# Patient Record
Sex: Female | Born: 1995 | Race: White | Hispanic: No | Marital: Single | Smoking: Former smoker
Health system: Southern US, Community
[De-identification: ages and names within clinical notes are randomized; demographics above are authoritative.]

## PROBLEM LIST (undated history)

## (undated) ENCOUNTER — Emergency Department (HOSPITAL_COMMUNITY): Admission: EM | Discharge status: Absent without leave | Payer: No Typology Code available for payment source

## (undated) DIAGNOSIS — Z789 Other specified health status: Secondary | ICD-10-CM

## (undated) HISTORY — PX: TONSILLECTOMY AND ADENOIDECTOMY: SUR1326

---

## 2020-01-27 ENCOUNTER — Observation Stay (HOSPITAL_COMMUNITY)
Admission: EM | Admit: 2020-01-27 | Discharge: 2020-01-28 | Disposition: A | Discharge status: Home / Self Care | Payer: No Typology Code available for payment source | Attending: Physician Assistant | Admitting: Physician Assistant

## 2020-01-27 ENCOUNTER — Other Ambulatory Visit: Payer: Self-pay

## 2020-01-27 ENCOUNTER — Emergency Department (HOSPITAL_COMMUNITY): Payer: No Typology Code available for payment source

## 2020-01-27 DIAGNOSIS — S36114A Minor laceration of liver, initial encounter: Secondary | ICD-10-CM | POA: Diagnosis not present

## 2020-01-27 DIAGNOSIS — M2550 Pain in unspecified joint: Secondary | ICD-10-CM | POA: Diagnosis not present

## 2020-01-27 DIAGNOSIS — Z23 Encounter for immunization: Secondary | ICD-10-CM | POA: Insufficient documentation

## 2020-01-27 DIAGNOSIS — S0990XA Unspecified injury of head, initial encounter: Secondary | ICD-10-CM | POA: Diagnosis present

## 2020-01-27 DIAGNOSIS — F17210 Nicotine dependence, cigarettes, uncomplicated: Secondary | ICD-10-CM | POA: Diagnosis not present

## 2020-01-27 DIAGNOSIS — S36113A Laceration of liver, unspecified degree, initial encounter: Secondary | ICD-10-CM | POA: Diagnosis present

## 2020-01-27 DIAGNOSIS — Z20822 Contact with and (suspected) exposure to covid-19: Secondary | ICD-10-CM | POA: Diagnosis not present

## 2020-01-27 DIAGNOSIS — S0102XA Laceration with foreign body of scalp, initial encounter: Principal | ICD-10-CM | POA: Insufficient documentation

## 2020-01-27 DIAGNOSIS — Y92410 Unspecified street and highway as the place of occurrence of the external cause: Secondary | ICD-10-CM | POA: Insufficient documentation

## 2020-01-27 HISTORY — DX: Other specified health status: Z78.9

## 2020-01-27 LAB — I-STAT CHEM 8, ED
BUN: 13 mg/dL (ref 6–20)
Calcium, Ion: 1.23 mmol/L (ref 1.15–1.40)
Chloride: 106 mmol/L (ref 98–111)
Creatinine, Ser: 0.9 mg/dL (ref 0.44–1.00)
Glucose, Bld: 93 mg/dL (ref 70–99)
HCT: 40 % (ref 36.0–46.0)
Hemoglobin: 13.6 g/dL (ref 12.0–15.0)
Potassium: 3.8 mmol/L (ref 3.5–5.1)
Sodium: 139 mmol/L (ref 135–145)
TCO2: 23 mmol/L (ref 22–32)

## 2020-01-27 LAB — I-STAT BETA HCG BLOOD, ED (MC, WL, AP ONLY): I-stat hCG, quantitative: <5 m[IU]/mL (ref <5)

## 2020-01-27 MED ORDER — LIDOCAINE HCL (PF) 1 % IJ SOLN
10.0000 mL | Freq: Once | INTRAMUSCULAR | Status: AC
  Administered 2020-01-27: 10 mL
  Filled 2020-01-27: qty 10

## 2020-01-27 MED ORDER — MORPHINE SULFATE (PF) 4 MG/ML IV SOLN
4.0000 mg | Freq: Once | INTRAVENOUS | Status: AC
  Administered 2020-01-27: 4 mg via INTRAVENOUS
  Filled 2020-01-27: qty 1

## 2020-01-27 MED ORDER — TETANUS-DIPHTH-ACELL PERTUSSIS 5-2.5-18.5 LF-MCG/0.5 IM SUSY
0.5000 mL | PREFILLED_SYRINGE | Freq: Once | INTRAMUSCULAR | Status: AC
  Administered 2020-01-27: 0.5 mL via INTRAMUSCULAR
  Filled 2020-01-27: qty 0.5
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
## 2020-01-28 ENCOUNTER — Emergency Department (HOSPITAL_COMMUNITY): Payer: No Typology Code available for payment source

## 2020-01-28 ENCOUNTER — Other Ambulatory Visit (HOSPITAL_COMMUNITY): Payer: Medicaid Other

## 2020-01-28 DIAGNOSIS — S36113A Laceration of liver, unspecified degree, initial encounter: Secondary | ICD-10-CM | POA: Diagnosis present

## 2020-01-28 DIAGNOSIS — S0102XA Laceration with foreign body of scalp, initial encounter: Secondary | ICD-10-CM | POA: Diagnosis not present

## 2020-01-28 LAB — COMPREHENSIVE METABOLIC PANEL
ALT: 48 U/L — ABNORMAL HIGH (ref 0–44)
ALT: 43 U/L (ref 0–44)
AST: 65 U/L — ABNORMAL HIGH (ref 15–41)
AST: 43 U/L — ABNORMAL HIGH (ref 15–41)
Albumin: 3.5 g/dL (ref 3.5–5.0)
Albumin: 3.3 g/dL — ABNORMAL LOW (ref 3.5–5.0)
Alkaline Phosphatase: 40 U/L (ref 38–126)
Alkaline Phosphatase: 38 U/L (ref 38–126)
Anion gap: 9 (ref 5–15)
Anion gap: 7 (ref 5–15)
BUN: 10 mg/dL (ref 6–20)
BUN: 7 mg/dL (ref 6–20)
CO2: 21 mmol/L — ABNORMAL LOW (ref 22–32)
CO2: 21 mmol/L — ABNORMAL LOW (ref 22–32)
Calcium: 8.8 mg/dL — ABNORMAL LOW (ref 8.9–10.3)
Calcium: 8.5 mg/dL — ABNORMAL LOW (ref 8.9–10.3)
Chloride: 106 mmol/L (ref 98–111)
Chloride: 109 mmol/L (ref 98–111)
Creatinine, Ser: 0.95 mg/dL (ref 0.44–1.00)
Creatinine, Ser: 0.95 mg/dL (ref 0.44–1.00)
GFR, Estimated: >60 mL/min (ref >60)
GFR, Estimated: >60 mL/min (ref >60)
Glucose, Bld: 111 mg/dL — ABNORMAL HIGH (ref 70–99)
Glucose, Bld: 89 mg/dL (ref 70–99)
Potassium: 3.5 mmol/L (ref 3.5–5.1)
Potassium: 3.7 mmol/L (ref 3.5–5.1)
Sodium: 136 mmol/L (ref 135–145)
Sodium: 137 mmol/L (ref 135–145)
Total Bilirubin: 0.8 mg/dL (ref 0.3–1.2)
Total Bilirubin: 1.1 mg/dL (ref 0.3–1.2)
Total Protein: 6 g/dL — ABNORMAL LOW (ref 6.5–8.1)
Total Protein: 5.7 g/dL — ABNORMAL LOW (ref 6.5–8.1)

## 2020-01-28 LAB — TYPE AND SCREEN
ABO/RH(D): A NEG
Antibody Screen: NEGATIVE

## 2020-01-28 LAB — CBC
HCT: 37.4 % (ref 36.0–46.0)
HCT: 36.2 % (ref 36.0–46.0)
Hemoglobin: 12.7 g/dL (ref 12.0–15.0)
Hemoglobin: 12.5 g/dL (ref 12.0–15.0)
MCH: 30.4 pg (ref 26.0–34.0)
MCH: 30.5 pg (ref 26.0–34.0)
MCHC: 34 g/dL (ref 30.0–36.0)
MCHC: 34.5 g/dL (ref 30.0–36.0)
MCV: 89.5 fL (ref 80.0–100.0)
MCV: 88.3 fL (ref 80.0–100.0)
Platelets: 299 10*3/uL (ref 150–400)
Platelets: 253 10*3/uL (ref 150–400)
RBC: 4.18 MIL/uL (ref 3.87–5.11)
RBC: 4.1 MIL/uL (ref 3.87–5.11)
RDW: 12 % (ref 11.5–15.5)
RDW: 12.1 % (ref 11.5–15.5)
WBC: 11 10*3/uL — ABNORMAL HIGH (ref 4.0–10.5)
WBC: 6.1 10*3/uL (ref 4.0–10.5)
nRBC: 0 % (ref 0.0–0.2)
nRBC: 0 % (ref 0.0–0.2)

## 2020-01-28 LAB — ABO/RH: ABO/RH(D): A NEG

## 2020-01-28 LAB — PROTIME-INR
INR: 1.1 (ref 0.8–1.2)
Prothrombin Time: 14.1 seconds (ref 11.4–15.2)

## 2020-01-28 LAB — RESP PANEL BY RT-PCR (FLU A&B, COVID) ARPGX2
Influenza A by PCR: NEGATIVE
Influenza B by PCR: NEGATIVE
SARS Coronavirus 2 by RT PCR: NEGATIVE

## 2020-01-28 LAB — HIV ANTIBODY (ROUTINE TESTING W REFLEX): HIV Screen 4th Generation wRfx: NONREACTIVE

## 2020-01-28 MED ORDER — ENOXAPARIN SODIUM 30 MG/0.3ML ~~LOC~~ SOLN: 30.0000 mg | Freq: Two times a day (BID) | SUBCUTANEOUS | Status: DC

## 2020-01-28 MED ORDER — MORPHINE SULFATE (PF) 2 MG/ML IV SOLN
2.0000 mg | INTRAVENOUS | Status: DC | PRN
Start: 2020-01-28 — End: 2020-01-28

## 2020-01-28 MED ORDER — ACETAMINOPHEN 325 MG PO TABS: 650.0000 mg | ORAL_TABLET | Freq: Four times a day (QID) | ORAL | 0 refills | Status: DC | PRN

## 2020-01-28 MED ORDER — FENTANYL CITRATE (PF) 100 MCG/2ML IJ SOLN
50.0000 ug | Freq: Once | INTRAMUSCULAR | Status: AC
  Administered 2020-01-28: 50 ug via INTRAVENOUS
  Filled 2020-01-28: qty 2

## 2020-01-28 MED ORDER — IBUPROFEN 600 MG PO TABS: 600.0000 mg | ORAL_TABLET | Freq: Four times a day (QID) | ORAL | 0 refills | Status: AC | PRN

## 2020-01-28 MED ORDER — ONDANSETRON 4 MG PO TBDP: 4.0000 mg | ORAL_TABLET | Freq: Four times a day (QID) | ORAL | Status: DC | PRN

## 2020-01-28 MED ORDER — OXYCODONE HCL 5 MG PO TABS: 5.0000 mg | ORAL_TABLET | Freq: Four times a day (QID) | ORAL | 0 refills | Status: AC | PRN

## 2020-01-28 MED ORDER — ACETAMINOPHEN 325 MG PO TABS: 650.0000 mg | ORAL_TABLET | ORAL | Status: DC | PRN

## 2020-01-28 MED ORDER — ONDANSETRON HCL 4 MG/2ML IJ SOLN: 4.0000 mg | Freq: Four times a day (QID) | INTRAMUSCULAR | Status: DC | PRN

## 2020-01-28 MED ORDER — SODIUM CHLORIDE 0.9 % IV SOLN: INTRAVENOUS | Status: DC

## 2020-01-28 MED ORDER — DOXYCYCLINE HYCLATE 100 MG PO CAPS: 100.0000 mg | ORAL_CAPSULE | Freq: Two times a day (BID) | ORAL | 0 refills | Status: AC

## 2020-01-28 MED ORDER — IOHEXOL 300 MG/ML  SOLN
100.0000 mL | Freq: Once | INTRAMUSCULAR | Status: AC | PRN
  Administered 2020-01-28: 100 mL via INTRAVENOUS

## 2020-01-28 MED ORDER — OXYCODONE HCL 5 MG PO TABS: 5.0000 mg | ORAL_TABLET | Freq: Four times a day (QID) | ORAL | 0 refills | Status: DC | PRN

## 2020-01-28 MED ORDER — OXYCODONE HCL 5 MG PO TABS
10.0000 mg | ORAL_TABLET | ORAL | Status: DC | PRN
  Administered 2020-01-28: 10 mg via ORAL
  Filled 2020-01-28: qty 2

## 2020-01-28 MED ORDER — OXYCODONE HCL 5 MG PO TABS
5.0000 mg | ORAL_TABLET | ORAL | Status: DC | PRN
  Administered 2020-01-28: 5 mg via ORAL
  Filled 2020-01-28 (×3): qty 1
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
## 2020-01-28 NOTE — Progress Notes (Signed)
Jessica Page to be D/C'd  per MD order. Discussed with the patient and all questions fully answered.  VSS, Skin clean, dry and intact without evidence of skin break down, no evidence of skin tears noted.  IV catheter discontinued intact. Site without signs and symptoms of complications. Dressing and pressure applied.  An After Visit Summary was printed and given to the patient.  D/c education completed with patient/family including follow up instructions, medication list, d/c activities limitations if indicated, with other d/c instructions as indicated by MD - patient able to verbalize understanding, all questions fully answered.   Patient instructed to return to ED, call 911, or call MD for any changes in condition.   Patient to be escorted via WC, and D/C home via private auto.

## 2020-01-28 NOTE — Plan of Care (Signed)

## 2020-01-28 NOTE — ED Notes (Signed)
Pt is in radiology at this time.  

## 2020-01-28 NOTE — ED Notes (Signed)
Dr. Renaye Rakers performed lacertation repair. Staples intact.
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
## 2020-02-05 NOTE — L&D Delivery Note (Addendum)
OB/GYN Faculty Practice Delivery Note  Jessica Page is a 25 y.o. G3P0020 at [redacted]w[redacted]d admitted for SROM and contractions.   GBS Status: Unknown Maximum Maternal Temperature: 99.1  Labor course: Initial SVE: 4 cm. Augmentation with: AROM. She then progressed to complete.  ROM: 12h 78m with meconium stained fluid  Birth: At 1947 a viable female was delivered via spontaneous vaginal delivery (Presentation: ROA). Nuchal cord present: No.  Shoulders and body delivered in usual fashion. Infant placed directly on mom's abdomen for bonding/skin-to-skin, baby dried and stimulated. Cord clamped x 2 after 1 minute and cut by FOB, Dylan.  Cord blood collected.  The placenta separated spontaneously and delivered via gentle cord traction.  Pitocin infused rapidly IV per protocol.  Fundus firm with massage.  Placenta inspected and appears to be intact with a 3 VC.  Placenta/Cord with the following complications: none.  Cord pH: n/a Sponge and instrument count were correct x2.  Intrapartum complications:  None Anesthesia:  epidural Episiotomy: none Lacerations:  none Suture Repair:  n/a EBL (mL): 150   Infant: APGAR (1 MIN):  7 APGAR (5 MINS):  8 Infant weight: pending  Mom to postpartum.  Baby to Couplet care / Skin to Skin. Placenta to L&D   Plans to Breastfeed Contraception: Depo-Provera injections Circumcision: N/A  Note sent to [facility]:  [facility]  for pp visit.  Raelyn Mora , MSN, CNM 12/15/2020 8:09 PM

## 2020-05-24 ENCOUNTER — Other Ambulatory Visit: Payer: Self-pay

## 2020-05-24 ENCOUNTER — Encounter: Payer: Self-pay | Admitting: Obstetrics and Gynecology

## 2020-05-24 ENCOUNTER — Ambulatory Visit (INDEPENDENT_AMBULATORY_CARE_PROVIDER_SITE_OTHER): Payer: Medicaid Other | Admitting: Obstetrics and Gynecology

## 2020-05-24 VITALS — BP 121/76 | HR 96 | Resp 16 | Ht 67.0 in | Wt 133.7 lb

## 2020-05-24 DIAGNOSIS — O09299 Supervision of pregnancy with other poor reproductive or obstetric history, unspecified trimester: Secondary | ICD-10-CM | POA: Diagnosis not present

## 2020-05-24 DIAGNOSIS — Z7689 Persons encountering health services in other specified circumstances: Secondary | ICD-10-CM

## 2020-05-24 NOTE — Progress Notes (Signed)
HPI:      [name] is a 25 y.o. G1P0 who LMP was Patient's last menstrual period was 03/31/2020 (exact date).  Subjective:   She presents today after presenting to the emergency department and being found to have a positive pregnancy test.  Patient believes she is approximately [redacted] weeks gestational age based on last menstrual period.  She is not having bleeding or cramping.  She is taking prenatal vitamins.  She was not attempting pregnancy but not preventing it. Of significant note her last 2 pregnancies ended in first trimester losses.  She underwent D&E for incomplete miscarriage followed by a blighted ovum which was completed by intravaginal misoprostol. She has occasional nausea and vomiting but not significant at this time.    Hx: The following portions of the patient's history were reviewed and updated as appropriate:             She  has a past medical history of Known health problems: none. She does not have any pertinent problems on file. She  has a past surgical history that includes Tonsillectomy and adenoidectomy. Her family history is not on file. She  reports that she has been smoking cigarettes. She has never used smokeless tobacco. She reports previous alcohol use. She reports current drug use. Drug: Marijuana. She has a current medication list which includes the following prescription(s): acetaminophen. She is allergic to codeine, amoxicillin, and penicillins.       Review of Systems:  Review of Systems  Constitutional: Denied constitutional symptoms, night sweats, recent illness, fatigue, fever, insomnia and weight loss.  Eyes: Denied eye symptoms, eye pain, photophobia, vision change and visual disturbance.  Ears/Nose/Throat/Neck: Denied ear, nose, throat or neck symptoms, hearing loss, nasal discharge, sinus congestion and sore throat.  Cardiovascular: Denied cardiovascular symptoms, arrhythmia, chest pain/pressure, edema, exercise intolerance, orthopnea and  palpitations.  Respiratory: Denied pulmonary symptoms, asthma, pleuritic pain, productive sputum, cough, dyspnea and wheezing.  Gastrointestinal: Denied, gastro-esophageal reflux, melena, nausea and vomiting.  Genitourinary: Denied genitourinary symptoms including symptomatic vaginal discharge, pelvic relaxation issues, and urinary complaints.  Musculoskeletal: Denied musculoskeletal symptoms, stiffness, swelling, muscle weakness and myalgia.  Dermatologic: Denied dermatology symptoms, rash and scar.  Neurologic: Denied neurology symptoms, dizziness, headache, neck pain and syncope.  Psychiatric: Denied psychiatric symptoms, anxiety and depression.  Endocrine: Denied endocrine symptoms including hot flashes and night sweats.   Meds:   Current Outpatient Medications on File Prior to Visit  Medication Sig Dispense Refill  . acetaminophen (TYLENOL) 325 MG tablet Take 2 tablets (650 mg total) by mouth every 6 (six) hours as needed for up to 30 doses for mild pain or moderate pain. 30 tablet 0   No current facility-administered medications on file prior to visit.          Objective:     Vitals:   05/24/20 1033  BP: 121/76  Pulse: 96  Resp: 16   Filed Weights   05/24/20 1033  Weight: 133 lb 11.2 oz (60.6 kg)                Assessment:    G1P0 Patient Active Problem List   Diagnosis Date Noted  . Liver laceration, closed 01/28/2020     1. Encounter to establish care   2. History of miscarriage, currently pregnant     Approximate 8 weeks estimated gestational age.   Plan:            Prenatal Plan 1.  The patient was given prenatal literature. 2.  She was continued on prenatal vitamins. 3.  A prenatal lab panel to be drawn at nurse visit. 4.  An ultrasound was ordered to better determine an EDC. 5.  A nurse visit was scheduled. 6.  Genetic testing and testing for other inheritable conditions discussed in detail. She will decide in the future whether to have these  labs performed. 7.  A general overview of pregnancy testing, visit schedule, ultrasound schedule, and prenatal care was discussed. 8.  COVID and its risks associated with pregnancy, prevention by limiting exposure and use of masks, as well as the risks and benefits of vaccination during pregnancy were discussed in detail.  [facility] policy regarding office and hospital visitation and testing was explained. 9.  Benefits of breast-feeding discussed in detail including both maternal and infant benefits. Ready Set Baby website discussed.   Orders Orders Placed This Encounter  Procedures  . US OB Comp Less 14 Wks    No orders of the defined types were placed in this encounter.     F/U  Return in about 5 weeks (around 06/28/2020). I spent 33 minutes involved in the care of this patient preparing to see the patient by obtaining and reviewing her medical history (including labs, imaging tests and prior procedures), documenting clinical information in the electronic health record (EHR), counseling and coordinating care plans, writing and sending prescriptions, ordering tests or procedures and directly communicating with the patient by discussing pertinent items from her history and physical exam as well as detailing my assessment and plan as noted above so that she has an informed understanding.  All of her questions were answered.  Elonda Husky, M.D. 05/24/2020 11:17 AM

## 2020-07-18 ENCOUNTER — Ambulatory Visit
Admission: RE | Admit: 2020-07-18 | Discharge: 2020-07-18 | Disposition: A | Discharge status: Home / Self Care | Payer: Medicaid Other | Source: Ambulatory Visit | Attending: Obstetrics and Gynecology | Admitting: Obstetrics and Gynecology

## 2020-07-18 ENCOUNTER — Other Ambulatory Visit: Payer: Self-pay

## 2020-07-18 ENCOUNTER — Other Ambulatory Visit: Payer: Self-pay | Admitting: Obstetrics and Gynecology

## 2020-07-18 DIAGNOSIS — O09299 Supervision of pregnancy with other poor reproductive or obstetric history, unspecified trimester: Secondary | ICD-10-CM

## 2020-07-27 ENCOUNTER — Telehealth: Payer: Self-pay | Admitting: Obstetrics and Gynecology

## 2020-07-27 NOTE — Telephone Encounter (Signed)
Patient called and stated that she never got a call back regarding her [place] results.  She states that she wants to make sure that everything is okay.  Since the [place] she has moved away and will be seeking care elsewhere.

## 2020-07-27 NOTE — Telephone Encounter (Signed)
Patient and dental Office called stating they need a dental clearance form faxed to [phone].  Please advise.

## 2020-07-27 NOTE — Telephone Encounter (Signed)
Patient's mother called asking about a call back regarding previous messages about pt having dental work .

## 2020-07-28 ENCOUNTER — Telehealth: Payer: Self-pay | Admitting: Obstetrics and Gynecology

## 2020-07-28 NOTE — Telephone Encounter (Signed)
Dental clearance sent via my chart.   Pt has not been seen in 2 months. She has recently moved but plans to delivery with [place]. Appt made for 6/28 at 730.   Pt appreciative of call.

## 2020-07-28 NOTE — Telephone Encounter (Signed)
Patient called x2 this morning, she is requesting a response in regard to her calls and mychart messages- asking for dental clearance to have teeth extraction today. Please Advise.

## 2020-08-01 ENCOUNTER — Encounter: Payer: Medicaid Other | Admitting: Obstetrics and Gynecology

## 2020-08-01 NOTE — Telephone Encounter (Signed)
LM for patient to return call.

## 2020-08-03 ENCOUNTER — Inpatient Hospital Stay (HOSPITAL_COMMUNITY)
Admission: AD | Admit: 2020-08-03 | Discharge: 2020-08-03 | Disposition: A | Discharge status: Home / Self Care | Payer: Medicaid Other | Attending: Obstetrics & Gynecology | Admitting: Obstetrics & Gynecology

## 2020-08-03 ENCOUNTER — Encounter (HOSPITAL_COMMUNITY): Payer: Self-pay | Admitting: Obstetrics & Gynecology

## 2020-08-03 ENCOUNTER — Other Ambulatory Visit: Payer: Self-pay

## 2020-08-03 DIAGNOSIS — Z3A00 Weeks of gestation of pregnancy not specified: Secondary | ICD-10-CM | POA: Insufficient documentation

## 2020-08-03 DIAGNOSIS — O26899 Other specified pregnancy related conditions, unspecified trimester: Secondary | ICD-10-CM | POA: Diagnosis present

## 2020-08-03 DIAGNOSIS — R1030 Lower abdominal pain, unspecified: Secondary | ICD-10-CM | POA: Insufficient documentation

## 2020-08-03 DIAGNOSIS — O219 Vomiting of pregnancy, unspecified: Secondary | ICD-10-CM | POA: Insufficient documentation

## 2020-08-03 LAB — URINALYSIS, ROUTINE W REFLEX MICROSCOPIC
Bilirubin Urine: NEGATIVE
Glucose, UA: 50 mg/dL — AB
Hgb urine dipstick: NEGATIVE
Ketones, ur: NEGATIVE mg/dL
Leukocytes,Ua: NEGATIVE
Nitrite: NEGATIVE
Protein, ur: NEGATIVE mg/dL
Specific Gravity, Urine: 1.01 (ref 1.005–1.030)
pH: 5 (ref 5.0–8.0)

## 2020-08-03 LAB — POC URINE PREG, ED: Preg Test, Ur: POSITIVE — AB
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
## 2020-08-03 NOTE — Progress Notes (Signed)
Not in lobby
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
## 2020-08-10 ENCOUNTER — Other Ambulatory Visit: Payer: Self-pay | Admitting: Obstetrics and Gynecology

## 2020-08-10 ENCOUNTER — Other Ambulatory Visit (HOSPITAL_COMMUNITY)
Admission: RE | Admit: 2020-08-10 | Discharge: 2020-08-10 | Disposition: A | Discharge status: Home / Self Care | Payer: Medicaid Other | Source: Ambulatory Visit | Attending: Obstetrics and Gynecology | Admitting: Obstetrics and Gynecology

## 2020-08-10 ENCOUNTER — Ambulatory Visit (INDEPENDENT_AMBULATORY_CARE_PROVIDER_SITE_OTHER): Payer: Medicaid Other | Admitting: Obstetrics and Gynecology

## 2020-08-10 ENCOUNTER — Other Ambulatory Visit: Payer: Self-pay

## 2020-08-10 ENCOUNTER — Encounter: Payer: Self-pay | Admitting: Obstetrics and Gynecology

## 2020-08-10 VITALS — BP 122/80 | HR 105 | Wt 146.9 lb

## 2020-08-10 DIAGNOSIS — Z3492 Encounter for supervision of normal pregnancy, unspecified, second trimester: Secondary | ICD-10-CM

## 2020-08-10 DIAGNOSIS — O0932 Supervision of pregnancy with insufficient antenatal care, second trimester: Secondary | ICD-10-CM

## 2020-08-10 DIAGNOSIS — Z124 Encounter for screening for malignant neoplasm of cervix: Secondary | ICD-10-CM

## 2020-08-10 DIAGNOSIS — O4420 Partial placenta previa NOS or without hemorrhage, unspecified trimester: Secondary | ICD-10-CM

## 2020-08-10 DIAGNOSIS — Z3A19 19 weeks gestation of pregnancy: Secondary | ICD-10-CM

## 2020-08-10 NOTE — Addendum Note (Signed)
Addended by: Dorian Pod on: 08/10/2020 03:04 PM   Modules accepted: Orders

## 2020-08-10 NOTE — Addendum Note (Signed)
Addended by: Elonda Husky on: 08/10/2020 03:12 PM   Modules accepted: Orders

## 2020-08-10 NOTE — Telephone Encounter (Signed)
Patient came in for appointment.  

## 2020-08-10 NOTE — Progress Notes (Signed)
NOB: Patient has missed several appointments.  MaterniT 21 today, AFP today.  Marginal placenta previa discussed in detail.  Follow-up ultrasound with anatomy scan in 2 weeks.  Pap performed  Physical examination General NAD, Conversant  HEENT Atraumatic; Op clear with mmm.  Normo-cephalic. Pupils reactive. Anicteric sclerae  Thyroid/Neck Smooth without nodularity or enlargement. Normal ROM.  Neck Supple.  Skin No rashes, lesions or ulceration. Normal palpated skin turgor. No nodularity.  Breasts: No masses or discharge.  Symmetric.  No axillary adenopathy.  Lungs: Clear to auscultation.No rales or wheezes. Normal Respiratory effort, no retractions.  Heart: NSR.  No murmurs or rubs appreciated. No periferal edema  Abdomen: Soft.  Non-tender.  No masses.  No HSM. No hernia  Extremities: Moves all appropriately.  Normal ROM for age. No lymphadenopathy.  Neuro: Oriented to PPT.  Normal mood. Normal affect.     Pelvic:   Vulva: Normal appearance.  No lesions.  Vagina: No lesions or abnormalities noted.  Support: Normal pelvic support.  Urethra No masses tenderness or scarring.  Meatus Normal size without lesions or prolapse.  Cervix: Normal appearance.  No lesions.  Anus: Normal exam.  No lesions.  Perineum: Normal exam.  No lesions.        Bimanual   Adnexae: No masses.  Non-tender to palpation.  Uterus: Enlarged. 18wks  Non-tender.  Mobile.  AV.  Adnexae: No masses.  Non-tender to palpation.  Cul-de-sac: Negative for abnormality.  Adnexae: No masses.  Non-tender to palpation.         Pelvimetry   Diagonal: Reached.  Spines: Average.  Sacrum: Concave.  Pubic Arch: Normal.

## 2020-08-11 LAB — MONITOR DRUG PROFILE 14(MW)
Amphetamine Scrn, Ur: NEGATIVE ng/mL
BARBITURATE SCREEN URINE: NEGATIVE ng/mL
BENZODIAZEPINE SCREEN, URINE: NEGATIVE ng/mL
Buprenorphine, Urine: NEGATIVE ng/mL
CANNABINOIDS UR QL SCN: NEGATIVE ng/mL
Cocaine (Metab) Scrn, Ur: NEGATIVE ng/mL
Creatinine(Crt), U: 76.4 mg/dL (ref 20.0–300.0)
Fentanyl, Urine: NEGATIVE pg/mL
Meperidine Screen, Urine: NEGATIVE ng/mL
Methadone Screen, Urine: NEGATIVE ng/mL
OXYCODONE+OXYMORPHONE UR QL SCN: NEGATIVE ng/mL
Opiate Scrn, Ur: NEGATIVE ng/mL
Ph of Urine: 5.7 (ref 4.5–8.9)
Phencyclidine Qn, Ur: NEGATIVE ng/mL
Propoxyphene Scrn, Ur: NEGATIVE ng/mL
SPECIFIC GRAVITY: 1.017
Tramadol Screen, Urine: NEGATIVE ng/mL

## 2020-08-11 LAB — NICOTINE SCREEN, URINE: Cotinine Ql Scrn, Ur: NEGATIVE ng/mL

## 2020-08-11 LAB — URINALYSIS, ROUTINE W REFLEX MICROSCOPIC
Bilirubin, UA: NEGATIVE
Ketones, UA: NEGATIVE
Leukocytes,UA: NEGATIVE
Nitrite, UA: NEGATIVE
Protein,UA: NEGATIVE
RBC, UA: NEGATIVE
Specific Gravity, UA: 1.016 (ref 1.005–1.030)
Urobilinogen, Ur: 0.2 mg/dL (ref 0.2–1.0)
pH, UA: 6 (ref 5.0–7.5)

## 2020-08-12 LAB — CBC WITH DIFFERENTIAL/PLATELET
Basophils Absolute: 0 10*3/uL (ref 0.0–0.2)
Basos: 0 %
EOS (ABSOLUTE): 0.1 10*3/uL (ref 0.0–0.4)
Eos: 2 %
Hematocrit: 32.7 % — ABNORMAL LOW (ref 34.0–46.6)
Hemoglobin: 11.1 g/dL (ref 11.1–15.9)
Immature Grans (Abs): 0.1 10*3/uL (ref 0.0–0.1)
Immature Granulocytes: 1 %
Lymphocytes Absolute: 1.7 10*3/uL (ref 0.7–3.1)
Lymphs: 21 %
MCH: 31.6 pg (ref 26.6–33.0)
MCHC: 33.9 g/dL (ref 31.5–35.7)
MCV: 93 fL (ref 79–97)
Monocytes Absolute: 0.7 10*3/uL (ref 0.1–0.9)
Monocytes: 9 %
Neutrophils Absolute: 5.3 10*3/uL (ref 1.4–7.0)
Neutrophils: 67 %
Platelets: 278 10*3/uL (ref 150–450)
RBC: 3.51 x10E6/uL — ABNORMAL LOW (ref 3.77–5.28)
RDW: 12.7 % (ref 11.7–15.4)
WBC: 7.9 10*3/uL (ref 3.4–10.8)

## 2020-08-12 LAB — ANTIBODY SCREEN: Antibody Screen: NEGATIVE

## 2020-08-12 LAB — GC/CHLAMYDIA PROBE AMP
Chlamydia trachomatis, NAA: NEGATIVE
Neisseria Gonorrhoeae by PCR: NEGATIVE

## 2020-08-12 LAB — TOXOPLASMA ANTIBODIES- IGG AND  IGM
Toxoplasma Antibody- IgM: <3 AU/mL (ref 0.0–7.9)
Toxoplasma IgG Ratio: <3 IU/mL (ref 0.0–7.1)

## 2020-08-12 LAB — AFP, SERUM, OPEN SPINA BIFIDA
AFP MoM: 1.62
AFP Value: 86.2 ng/mL
Gest. Age on Collection Date: 19 weeks
Maternal Age At EDD: 22.9 yr
OSBR Risk 1 IN: 1995
Test Results:: NEGATIVE
Weight: 146 [lb_av]

## 2020-08-12 LAB — ABO AND RH: Rh Factor: NEGATIVE

## 2020-08-12 LAB — RUBELLA SCREEN: Rubella Antibodies, IGG: <0.9 index — ABNORMAL LOW (ref >0.99)

## 2020-08-12 LAB — VARICELLA ZOSTER ANTIBODY, IGG: Varicella zoster IgG: <135 index — ABNORMAL LOW (ref >165)

## 2020-08-12 LAB — RPR: RPR Ser Ql: NONREACTIVE

## 2020-08-12 LAB — CULTURE, OB URINE

## 2020-08-12 LAB — URINE CULTURE, OB REFLEX

## 2020-08-12 LAB — HIV ANTIBODY (ROUTINE TESTING W REFLEX): HIV Screen 4th Generation wRfx: NONREACTIVE

## 2020-08-14 LAB — MATERNIT21  PLUS CORE+ESS+SCA, BLOOD
11q23 deletion (Jacobsen): NOT DETECTED
15q11 deletion (PW Angelman): NOT DETECTED
1p36 deletion syndrome: NOT DETECTED
22q11 deletion (DiGeorge): NOT DETECTED
4p16 deletion(Wolf-Hirschhorn): NOT DETECTED
5p15 deletion (Cri-du-chat): NOT DETECTED
8q24 deletion (Langer-Giedion): NOT DETECTED
Fetal Fraction: 16
Monosomy X (Turner Syndrome): NOT DETECTED
Result (T21): NEGATIVE
Trisomy 13 (Patau syndrome): NEGATIVE
Trisomy 16: NOT DETECTED
Trisomy 18 (Edwards syndrome): NEGATIVE
Trisomy 21 (Down syndrome): NEGATIVE
Trisomy 22: NOT DETECTED
XXX (Triple X Syndrome): NOT DETECTED
XXY (Klinefelter Syndrome): NOT DETECTED
XYY (Jacobs Syndrome): NOT DETECTED

## 2020-08-14 LAB — CYTOLOGY - PAP: Diagnosis: NEGATIVE

## 2020-08-17 ENCOUNTER — Encounter: Payer: Self-pay | Admitting: Obstetrics and Gynecology

## 2020-09-08 ENCOUNTER — Encounter: Payer: Medicaid Other | Admitting: Obstetrics and Gynecology

## 2020-09-11 ENCOUNTER — Encounter: Payer: Self-pay | Admitting: Obstetrics and Gynecology

## 2020-09-18 ENCOUNTER — Encounter: Payer: Medicaid Other | Admitting: Obstetrics and Gynecology

## 2020-09-19 ENCOUNTER — Other Ambulatory Visit: Payer: Self-pay

## 2020-09-19 ENCOUNTER — Other Ambulatory Visit: Payer: Self-pay | Admitting: Obstetrics and Gynecology

## 2020-09-19 ENCOUNTER — Ambulatory Visit: Payer: Medicaid Other | Attending: Obstetrics

## 2020-09-19 DIAGNOSIS — Z3A25 25 weeks gestation of pregnancy: Secondary | ICD-10-CM | POA: Insufficient documentation

## 2020-09-19 DIAGNOSIS — O0932 Supervision of pregnancy with insufficient antenatal care, second trimester: Secondary | ICD-10-CM | POA: Diagnosis not present

## 2020-09-19 DIAGNOSIS — Z363 Encounter for antenatal screening for malformations: Secondary | ICD-10-CM | POA: Diagnosis not present

## 2020-09-21 DIAGNOSIS — Z789 Other specified health status: Secondary | ICD-10-CM | POA: Insufficient documentation

## 2020-09-22 ENCOUNTER — Ambulatory Visit: Payer: Medicaid Other | Admitting: Cardiology

## 2020-10-05 ENCOUNTER — Other Ambulatory Visit: Payer: Self-pay

## 2020-10-05 DIAGNOSIS — Z3482 Encounter for supervision of other normal pregnancy, second trimester: Secondary | ICD-10-CM

## 2020-10-05 DIAGNOSIS — Z3A27 27 weeks gestation of pregnancy: Secondary | ICD-10-CM

## 2020-10-06 ENCOUNTER — Ambulatory Visit (INDEPENDENT_AMBULATORY_CARE_PROVIDER_SITE_OTHER): Payer: Medicaid Other | Admitting: Obstetrics and Gynecology

## 2020-10-06 ENCOUNTER — Other Ambulatory Visit: Payer: Medicaid Other

## 2020-10-06 ENCOUNTER — Encounter: Payer: Self-pay | Admitting: Obstetrics and Gynecology

## 2020-10-06 ENCOUNTER — Other Ambulatory Visit: Payer: Self-pay

## 2020-10-06 VITALS — BP 124/72 | HR 101 | Wt 161.2 lb

## 2020-10-06 DIAGNOSIS — Z3482 Encounter for supervision of other normal pregnancy, second trimester: Secondary | ICD-10-CM | POA: Diagnosis not present

## 2020-10-06 DIAGNOSIS — Z6791 Unspecified blood type, Rh negative: Secondary | ICD-10-CM | POA: Diagnosis not present

## 2020-10-06 DIAGNOSIS — Z23 Encounter for immunization: Secondary | ICD-10-CM

## 2020-10-06 DIAGNOSIS — Z3A27 27 weeks gestation of pregnancy: Secondary | ICD-10-CM

## 2020-10-06 DIAGNOSIS — O26899 Other specified pregnancy related conditions, unspecified trimester: Secondary | ICD-10-CM

## 2020-10-06 MED ORDER — RHO D IMMUNE GLOBULIN 1500 UNIT/2ML IJ SOSY
300.0000 ug | PREFILLED_SYRINGE | Freq: Once | INTRAMUSCULAR | Status: AC
  Administered 2020-10-06: 300 ug via INTRAMUSCULAR

## 2020-10-06 MED ORDER — TETANUS-DIPHTH-ACELL PERTUSSIS 5-2.5-18.5 LF-MCG/0.5 IM SUSY
0.5000 mL | PREFILLED_SYRINGE | Freq: Once | INTRAMUSCULAR | Status: AC
  Administered 2020-10-06: 0.5 mL via INTRAMUSCULAR

## 2020-10-06 NOTE — Patient Instructions (Addendum)
Please email [place] at: [email or web address]. We do have a phone number, but it is a voicemail only, [phone]. We can then call the client back and collect info from them to ensure they qualify for no-cost doula services and match them with a doula.   You may also complete a Request Form online.  Please view the Doula Request Form ( [email or web address]) and submit it if you meet the qualifying criteria listed within the form. If you do not meet the criteria for the program to receive a doula at no cost, we have attached a list of private doulas who are credentialed with [facility] that you can connect with and retain for your birth.   Currently, our program is only for birth, but most of our doulas connect with clients and provide support in the prenatal period as well.    Third Trimester of Pregnancy The third trimester of pregnancy is from week 28 through week 40. This is months 7 through 9. The third trimester is a time when the unborn baby (fetus) is growing rapidly. At the end of the ninth month, the fetus is about 20 inches long and weighs 6-10 pounds. Body changes during your third trimester During the third trimester, your body will continue to go through many changes. The changes vary and generally return to normal after your baby is born. Physical changes Your weight will continue to increase. You can expect to gain 25-35 pounds (11-16 kg) by the end of the pregnancy if you begin pregnancy at a normal weight. If you are underweight, you can expect to gain 28-40 lb (about 13-18 kg), and if you are overweight, you can expect to gain 15-25 lb (about 7-11 kg). You may begin to get stretch marks on your hips, abdomen, and breasts. Your breasts will continue to grow and may hurt. A yellow fluid (colostrum) may leak from your breasts. This is the first milk you are producing for your baby. You may have changes in your hair. These can include thickening of your hair,  rapid growth, and changes in texture. Some people also have hair loss during or after pregnancy, or hair that feels dry or thin. Your belly button may stick out. You may notice more swelling in your hands, face, or ankles. Health changes You may have heartburn. You may have constipation. You may develop hemorrhoids. You may develop swollen, bulging veins (varicose veins) in your legs. You may have increased body aches in the pelvis, back, or thighs. This is due to weight gain and increased hormones that are relaxing your joints. You may have increased tingling or numbness in your hands, arms, and legs. The skin on your abdomen may also feel numb. You may feel short of breath because of your expanding uterus. Other changes You may urinate more often because the fetus is moving lower into your pelvis and pressing on your bladder. You may have more problems sleeping. This may be caused by the size of your abdomen, an increased need to urinate, and an increase in your body's metabolism. You may notice the fetus "dropping," or moving lower in your abdomen (lightening). You may have increased vaginal discharge. You may notice that you have pain around your pelvic bone as your uterus distends. Follow these instructions at home: Medicines Follow your health care provider's instructions regarding medicine use. Specific medicines may be either safe or unsafe to take during pregnancy. Do not take any medicines unless approved by your health care provider. Take a prenatal vitamin that  contains at least 600 micrograms (mcg) of folic acid. Eating and drinking Eat a healthy diet that includes fresh fruits and vegetables, whole grains, good sources of protein such as meat, eggs, or tofu, and low-fat dairy products. Avoid raw meat and unpasteurized juice, milk, and cheese. These carry germs that can harm you and your baby. Eat 4 or 5 small meals rather than 3 large meals a day. You may need to take these  actions to prevent or treat constipation: Drink enough fluid to keep your urine pale yellow. Eat foods that are high in fiber, such as beans, whole grains, and fresh fruits and vegetables. Limit foods that are high in fat and processed sugars, such as fried or sweet foods. Activity Exercise only as directed by your health care provider. Most people can continue their usual exercise routine during pregnancy. Try to exercise for 30 minutes at least 5 days a week. Stop exercising if you experience contractions in the uterus. Stop exercising if you develop pain or cramping in the lower abdomen or lower back. Avoid heavy lifting. Do not exercise if it is very hot or humid or if you are at a high altitude. If you choose to, you may continue to have sex unless your health care provider tells you not to. Relieving pain and discomfort Take frequent breaks and rest with your legs raised (elevated) if you have leg cramps or low back pain. Take warm sitz baths to soothe any pain or discomfort caused by hemorrhoids. Use hemorrhoid cream if your health care provider approves. Wear a supportive bra to prevent discomfort from breast tenderness. If you develop varicose veins: Wear support hose as told by your health care provider. Elevate your feet for 15 minutes, 3-4 times a day. Limit salt in your diet. Safety Talk to your health care provider before traveling far distances. Do not use hot tubs, steam rooms, or saunas. Wear your seat belt at all times when driving or riding in a car. Talk with your health care provider if someone is verbally or physically abusive to you. Preparing for birth To prepare for the arrival of your baby: Take prenatal classes to understand, practice, and ask questions about labor and delivery. Visit the hospital and tour the maternity area. Purchase a rear-facing car seat and make sure you know how to install it in your car. Prepare the baby's room or sleeping area. Make sure  to remove all pillows and stuffed animals from the baby's crib to prevent suffocation. General instructions Avoid cat litter boxes and soil used by cats. These carry germs that can cause birth defects in the baby. If you have a cat, ask someone to clean the litter box for you. Do not douche or use tampons. Do not use scented sanitary pads. Do not use any products that contain nicotine or tobacco, such as cigarettes, e-cigarettes, and chewing tobacco. If you need help quitting, ask your health care provider. Do not use any herbal remedies, illegal drugs, or medicines that were not prescribed to you. Chemicals in these products can harm your baby. Do not drink alcohol. You will have more frequent prenatal exams during the third trimester. During a routine prenatal visit, your health care provider will do a physical exam, perform tests, and discuss your overall health. Keep all follow-up visits. This is important. Where to find more information American Pregnancy Association: americanpregnancy.org [employer]st[employer]ians [employer] Gynecologists: https://www.todd-brady.net/ Office on [employer] Health: MightyReward.co.nz Contact a health care provider if you have: A  fever. Mild pelvic cramps, pelvic pressure, or nagging pain in your abdominal area or lower back. Vomiting or diarrhea. Bad-smelling vaginal discharge or foul-smelling urine. Pain when you urinate. A headache that does not go away when you take medicine. Visual changes or see spots in front of your eyes. Get help right away if: Your water breaks. You have regular contractions less than 5 minutes apart. You have spotting or bleeding from your vagina. You have severe abdominal pain. You have difficulty breathing. You have chest pain. You have fainting spells. You have not felt your baby move for the time period told by your health care provider. You have new or increased pain, swelling, or redness in an arm or  leg. Summary The third trimester of pregnancy is from week 28 through week 40 (months 7 through 9). You may have more problems sleeping. This can be caused by the size of your abdomen, an increased need to urinate, and an increase in your body's metabolism. You will have more frequent prenatal exams during the third trimester. Keep all follow-up visits. This is important. This information is not intended to replace advice given to you by your health care provider. Make sure you discuss any questions you have with your health care provider. Document Revised: 06/30/2019 Document Reviewed: 05/06/2019 Elsevier Patient Education  2022 Elsevier Inc.   Exclusive Breastfeeding Exclusive breastfeeding means feeding a baby with breast milk only, except for vitamin and mineral drops or medicines. It is recommended that babies be exclusively breastfed for the first 6 months of life. Breastfeeding can continue until a baby is 1 year or older, if wanted by both mother and child. Exclusive breastfeeding for at least 6 months has many benefits for both the mother and the baby. How does exclusive breastfeeding benefit my baby? It ensures your baby gets the best nutrition. It helps develop your baby's disease-fighting system (immune system) by providing antibodies that help fight off germs. It may lower your baby's risk for: Problems with the stomach and intestines. Allergies. Ear infections. Infections of the nose, throat, or airways (respiratory infections). Obesity. Diabetes. Sudden infant death syndrome (SIDS). How does exclusive breastfeeding benefit me? It helps improve your recovery from giving birth by: Reducing blood loss after delivery. Speeding up how quickly your uterus heals. Reducing your risk of a type of depression that happens after giving birth (postpartum depression). It increases the time before your menstrual periods return. This can help to delay pregnancy if you are not using birth  control. It creates a unique bond between you and your baby. Tips for exclusive breastfeeding Start breastfeeding within your baby's first hour of life. Avoid giving your baby infant formula, water, or solid food before he or she is 5 months old, unless told by your health care provider. Feed your baby on demand. This means feeding anytime your child expresses signs of hunger. Doing this can help to keep up your milk supply. Signs of hunger include: Becoming more alert and active and moving restlessly. Rooting. This is when your baby turns his or her head from side to side looking for the breast. Sometimes babies will also start smacking their lips as though they are sucking. Bringing hands to the mouth. Crying. This is a late sign of hunger. Limit the use of bottles and pacifiers during the first 3-4 weeks of breastfeeding. Doing so may encourage more effective sucking patterns and help establish a good milk supply. Drink plenty of fluids so your urine is pale yellow. Eat  a healthy diet that includes fresh fruits and vegetables, whole grains, lean meat, fish, eggs, beans, nuts, seeds, and low-fat dairy products. If you decide to bottle-feed: Continue to offer your baby breast milk by using a breast pump to keep up your milk supply. Pump after feedings and store extra breast milk. Offer only breast milk in a bottle. What happens if I start supplementing feedings? If you work outside the home, it may be hard to continue exclusive breastfeeding. However, you can make sure your baby continues to receive only breast milk if you pump on a schedule and provide breast milk through bottle feeding. Sometimes you may need to supplement feedings. Your health care provider may recommend giving your baby formula with breast milk or rehydration liquids with breast milk if your baby was born early (prematurely), is not gaining enough weight, or is showing signs of dehydration. If you start supplementing  feedings, your baby will drink less breast milk and your body will respond by making less breast milk. If you choose to supplement feedings but would like to keep up your milk supply so you can breastfeed your baby exclusively later on, you can pump your breast milk on a schedule and give your baby breast milk by bottle. Questions to ask your health care provider or lactation specialist Is exclusive breastfeeding right for me? How long should I exclusively breastfeed? What support is available to help me in exclusive breastfeeding? Where to find support You can find support through: Health care providers and lactation specialists They can help by: Giving you educational materials. Giving you information about where you can get supplies such as breast pumps and nursing bras. Providing you with counseling if you need emotional support. Sharing feeding basics with you, such as effective positions for breastfeeding. Working through feeding challenges with you. Your peers Your friends, family, and other women can help by: Sharing their experiences and success stories. Giving you new ideas. Encouraging you to keep breastfeeding even when it feels difficult. Education programs These programs can help you prepare for breastfeeding before your baby is born. Educational programs include: Classes. Print handouts. Videos. Telephone support. One-on-one instruction. Where to find more information [employer]: llli.org Contact a health care provider or lactation specialist if: You feel like you want to stop breastfeeding or have become frustrated with breastfeeding. Your child is not gaining weight. Your child is more than 751 week old and wetting fewer than 6 diapers in a 24-hour period. You are feeling sad and depressed. This may be a sign of postpartum depression. Summary Exclusive breastfeeding means feeding a baby with breast milk only. Exclusive breastfeeding for the first 6  months of your baby's life is recommended. Exclusive breastfeeding provides many benefits for both you and your baby. You can find support for breastfeeding through health care providers, friends and family, and educational programs. This information is not intended to replace advice given to you by your health care provider. Make sure you discuss any questions you have with your health care provider. Document Revised: 07/13/2019 Document Reviewed: 07/13/2019 Elsevier Patient Education  2022 [employer]

## 2020-10-06 NOTE — Addendum Note (Signed)
Addended by: Silvano Bilis on: 10/06/2020 04:37 PM   Modules accepted: Orders

## 2020-10-06 NOTE — Progress Notes (Signed)
ROB: Doing well overall, no complaints.  For 28 week labs today.  Plans to  plans to breastfeed, desires Depo Provera for contraception. For Tdap today, signed blood consent.  Rhogam administered. Discussed birth plan with patient, notes she desires a natural labor. Previously noted placenta previa has resolved on 8/16 scan.  RTC in 2 weeks.    The following were addressed during this visit:  Breastfeeding Education - Early initiation of breastfeeding    Comments: Keeps milk supply adequate, helps contract uterus and slow bleeding, and early milk is the perfect first food and is easy to digest.   - The importance of exclusive breastfeeding    Comments: Provides antibodies, Lower risk of breast and ovarian cancers, and type-2 diabetes,Helps your body recover, Reduced chance of SIDS.   - The importance of early skin-to-skin contact    Comments:  Keeps baby warm and secure, helps keep baby's blood sugar up and breathing steady, easier to bond and breastfeed, and helps calm baby.  - Rooming-in on a 24-hour basis    Comments: Easier to learn baby's feeding cues, easier to bond and get to know each other, and encourages milk production.   - Feeding on demand or baby-led feeding    Comments: Helps prevent breastfeeding complications, helps bring in good milk supply, prevents under or overfeeding, and helps baby feel content and satisfied   - Frequent feeding to help assure optimal milk production    Comments: Making a full supply of milk requires frequent removal of milk from breasts, infant will eat 8-12 times in 24 hours, if separated from infant use breast massage, hand expression and/ or pumping to remove milk from breasts.   - Effective positioning and attachment    Comments: Helps my baby to get enough breast milk, helps to produce an adequate milk supply, and helps prevent nipple pain and damage   - Exclusive breastfeeding for the first 6 months    Comments: Builds a healthy milk  supply and keeps it up, protects baby from sickness and disease, and breastmilk has everything your baby needs for the first 6 months.  - Individualized Education    Comments: Contraindications to breastfeeding and other special medical conditions

## 2020-10-06 NOTE — Progress Notes (Signed)
OB-pt present for routine prenatal care. Pt stated that she was doing well no problems. Tdap, BTC and 1 hr gt completed today.

## 2020-10-07 LAB — CBC
Hematocrit: 26.1 % — ABNORMAL LOW (ref 34.0–46.6)
Hemoglobin: 8.7 g/dL — ABNORMAL LOW (ref 11.1–15.9)
MCH: 30.1 pg (ref 26.6–33.0)
MCHC: 33.3 g/dL (ref 31.5–35.7)
MCV: 90 fL (ref 79–97)
Platelets: 361 10*3/uL (ref 150–450)
RBC: 2.89 x10E6/uL — ABNORMAL LOW (ref 3.77–5.28)
RDW: 11.5 % — ABNORMAL LOW (ref 11.7–15.4)
WBC: 8.3 10*3/uL (ref 3.4–10.8)

## 2020-10-07 LAB — GLUCOSE, 1 HOUR GESTATIONAL: Gestational Diabetes Screen: 72 mg/dL (ref 65–139)

## 2020-10-07 LAB — RPR: RPR Ser Ql: NONREACTIVE

## 2020-10-25 ENCOUNTER — Encounter: Payer: Medicaid Other | Admitting: Obstetrics and Gynecology

## 2020-10-25 DIAGNOSIS — Z3483 Encounter for supervision of other normal pregnancy, third trimester: Secondary | ICD-10-CM

## 2020-10-25 DIAGNOSIS — Z3A30 30 weeks gestation of pregnancy: Secondary | ICD-10-CM

## 2020-11-07 ENCOUNTER — Encounter: Payer: Medicaid Other | Admitting: Obstetrics and Gynecology

## 2020-11-07 DIAGNOSIS — Z3A32 32 weeks gestation of pregnancy: Secondary | ICD-10-CM

## 2020-11-07 DIAGNOSIS — Z3403 Encounter for supervision of normal first pregnancy, third trimester: Secondary | ICD-10-CM

## 2020-11-14 ENCOUNTER — Other Ambulatory Visit: Payer: Self-pay

## 2020-11-14 ENCOUNTER — Inpatient Hospital Stay (HOSPITAL_COMMUNITY)
Admission: AD | Admit: 2020-11-14 | Discharge: 2020-11-14 | Disposition: A | Discharge status: Home / Self Care | Payer: Medicaid Other | Attending: Obstetrics and Gynecology | Admitting: Obstetrics and Gynecology

## 2020-11-14 DIAGNOSIS — R030 Elevated blood-pressure reading, without diagnosis of hypertension: Secondary | ICD-10-CM | POA: Insufficient documentation

## 2020-11-14 DIAGNOSIS — Z5329 Procedure and treatment not carried out because of patient's decision for other reasons: Secondary | ICD-10-CM | POA: Diagnosis not present

## 2020-11-14 DIAGNOSIS — F17210 Nicotine dependence, cigarettes, uncomplicated: Secondary | ICD-10-CM | POA: Insufficient documentation

## 2020-11-14 DIAGNOSIS — Z880 Allergy status to penicillin: Secondary | ICD-10-CM | POA: Diagnosis not present

## 2020-11-14 DIAGNOSIS — O99333 Smoking (tobacco) complicating pregnancy, third trimester: Secondary | ICD-10-CM | POA: Diagnosis not present

## 2020-11-14 DIAGNOSIS — Z3A33 33 weeks gestation of pregnancy: Secondary | ICD-10-CM | POA: Insufficient documentation

## 2020-11-14 DIAGNOSIS — O26893 Other specified pregnancy related conditions, third trimester: Secondary | ICD-10-CM | POA: Insufficient documentation

## 2020-11-14 LAB — COMPREHENSIVE METABOLIC PANEL
ALT: 16 U/L (ref 0–44)
AST: 15 U/L (ref 15–41)
Albumin: 2.1 g/dL — ABNORMAL LOW (ref 3.5–5.0)
Alkaline Phosphatase: 116 U/L (ref 38–126)
Anion gap: 7 (ref 5–15)
BUN: <5 mg/dL — ABNORMAL LOW (ref 6–20)
CO2: 23 mmol/L (ref 22–32)
Calcium: 8 mg/dL — ABNORMAL LOW (ref 8.9–10.3)
Chloride: 107 mmol/L (ref 98–111)
Creatinine, Ser: 0.65 mg/dL (ref 0.44–1.00)
GFR, Estimated: >60 mL/min (ref >60)
Glucose, Bld: 102 mg/dL — ABNORMAL HIGH (ref 70–99)
Potassium: 3.4 mmol/L — ABNORMAL LOW (ref 3.5–5.1)
Sodium: 137 mmol/L (ref 135–145)
Total Bilirubin: 0.2 mg/dL — ABNORMAL LOW (ref 0.3–1.2)
Total Protein: 5.2 g/dL — ABNORMAL LOW (ref 6.5–8.1)

## 2020-11-14 LAB — CBC
HCT: 25.6 % — ABNORMAL LOW (ref 36.0–46.0)
Hemoglobin: 8.3 g/dL — ABNORMAL LOW (ref 12.0–15.0)
MCH: 27.6 pg (ref 26.0–34.0)
MCHC: 32.4 g/dL (ref 30.0–36.0)
MCV: 85 fL (ref 80.0–100.0)
Platelets: 260 10*3/uL (ref 150–400)
RBC: 3.01 MIL/uL — ABNORMAL LOW (ref 3.87–5.11)
RDW: 12.5 % (ref 11.5–15.5)
WBC: 7.1 10*3/uL (ref 4.0–10.5)
nRBC: 0 % (ref 0.0–0.2)

## 2020-11-14 NOTE — MAU Provider Note (Signed)
Chief Complaint:  Hypertension and Foot Swelling   None    HPI  HPI: Jessica Page is a 25 y.o. G3P0020 at 90w2dwho presents to maternity admissions reporting elevated BP to Triage nurse.    She left AMA before being seen   Past Medical History: Past Medical History:  Diagnosis Date   Known health problems: none     Past obstetric history: OB History  Gravida Para Term Preterm AB Living  3       2    SAB IAB Ectopic Multiple Live Births  2            # Outcome Date GA Lbr Len/2nd Weight Sex Delivery Anes PTL Lv  3 Current           2 SAB           1 SAB             Past Surgical History: Past Surgical History:  Procedure Laterality Date   TONSILLECTOMY AND ADENOIDECTOMY      Family History: Family History  Problem Relation Age of Onset   Healthy Mother    Healthy Father     Social History: Social History   Tobacco Use   Smoking status: Every Day    Packs/day: 0.10    Types: Cigarettes   Smokeless tobacco: Never  Vaping Use   Vaping Use: Former  Substance Use Topics   Alcohol use: Not Currently   Drug use: Not Currently    Types: Marijuana    Comment: last "hit" was at end of "July"    Allergies:  Allergies  Allergen Reactions   Bromelains     Cough Syrup   Codeine Diarrhea   Amoxicillin Rash   Penicillins Rash    Meds:  Medications Prior to Admission  Medication Sig Dispense Refill Last Dose   Prenatal Vit-Fe Fumarate-FA (MULTIVITAMIN-PRENATAL) 27-0.8 MG TABS tablet Take 1 tablet by mouth daily at 12 noon.       I have reviewed patient's Past Medical Hx, Surgical Hx, Family Hx, Social Hx, medications and allergies.   ROS:  Review of Systems Other systems negative  Physical Exam  Patient Vitals for the past 24 hrs:  BP Temp Pulse Resp SpO2 Height Weight  11/14/20 2128 129/78 -- (!) 103 -- 100 % -- --  11/14/20 2122 -- 98.2 F (36.8 C) -- 17 -- 5\' 7"  (1.702 m) 77.6 kg    Exam not done as pt left lobby without being seen.    Labs: Results for orders placed or performed during the hospital encounter of 11/14/20 (from the past 24 hour(s))  CBC     Status: Abnormal   Collection Time: 11/14/20 10:16 PM  Result Value Ref Range   WBC 7.1 4.0 - 10.5 K/uL   RBC 3.01 (L) 3.87 - 5.11 MIL/uL   Hemoglobin 8.3 (L) 12.0 - 15.0 g/dL   HCT 01/14/21 (L) 52.7 - 78.2 %   MCV 85.0 80.0 - 100.0 fL   MCH 27.6 26.0 - 34.0 pg   MCHC 32.4 30.0 - 36.0 g/dL   RDW 42.3 53.6 - 14.4 %   Platelets 260 150 - 400 K/uL   nRBC 0.0 0.0 - 0.2 %  Comprehensive metabolic panel     Status: Abnormal   Collection Time: 11/14/20 10:16 PM  Result Value Ref Range   Sodium 137 135 - 145 mmol/L   Potassium 3.4 (L) 3.5 - 5.1 mmol/L   Chloride 107 98 - 111 mmol/L  CO2 23 22 - 32 mmol/L   Glucose, Bld 102 (H) 70 - 99 mg/dL   BUN <5 (L) 6 - 20 mg/dL   Creatinine, Ser 5.63 0.44 - 1.00 mg/dL   Calcium 8.0 (L) 8.9 - 10.3 mg/dL   Total Protein 5.2 (L) 6.5 - 8.1 g/dL   Albumin 2.1 (L) 3.5 - 5.0 g/dL   AST 15 15 - 41 U/L   ALT 16 0 - 44 U/L   Alkaline Phosphatase 116 38 - 126 U/L   Total Bilirubin 0.2 (L) 0.3 - 1.2 mg/dL   GFR, Estimated >89 >37 mL/min   Anion gap 7 5 - 15   Protein/Creat Ratio ordered but pt never got urine  A/Negative/-- (07/07 1510)  Imaging:  No results found.  MAU Course/MDM: I have ordered labs and reviewed results.  Left without being seen  Assessment: Single IUP at [redacted]w[redacted]d Reported home BP elevation, normal here  Plan: Left without being seen  Wynelle Bourgeois CNM, MSN Certified Nurse-Midwife 11/14/2020 11:50 PM

## 2020-11-14 NOTE — MAU Note (Addendum)
My b/p was 158/115 earlier today and I have had swelling in feet and legs last couple of days. Denies VB or LOF. More white/yellow vaginal d/c than usual. No pain but some pelvic pressure. I have had a headache today. No visual changes. Pt unable to void currently. Given water to drink and urine cup and to let admission person know if she obtains urine. Pt voices understanding

## 2020-11-14 NOTE — MAU Note (Signed)
Not in lobby

## 2020-11-14 NOTE — Progress Notes (Signed)
Pt called and not in lobby or Family RM

## 2020-11-14 NOTE — Progress Notes (Signed)
Pt called and not in lobby or Family RM 

## 2020-11-21 ENCOUNTER — Encounter: Payer: Medicaid Other | Admitting: Obstetrics and Gynecology

## 2020-11-22 ENCOUNTER — Other Ambulatory Visit: Payer: Self-pay

## 2020-11-22 ENCOUNTER — Ambulatory Visit (INDEPENDENT_AMBULATORY_CARE_PROVIDER_SITE_OTHER): Payer: Medicaid Other | Admitting: Obstetrics and Gynecology

## 2020-11-22 ENCOUNTER — Encounter: Payer: Self-pay | Admitting: Obstetrics and Gynecology

## 2020-11-22 VITALS — BP 136/82 | HR 108 | Wt 171.3 lb

## 2020-11-22 DIAGNOSIS — Z3A34 34 weeks gestation of pregnancy: Secondary | ICD-10-CM

## 2020-11-22 DIAGNOSIS — Z3483 Encounter for supervision of other normal pregnancy, third trimester: Secondary | ICD-10-CM

## 2020-11-22 LAB — POCT URINALYSIS DIPSTICK OB
Bilirubin, UA: NEGATIVE
Blood, UA: NEGATIVE
Glucose, UA: NEGATIVE
Ketones, UA: NEGATIVE
Leukocytes, UA: NEGATIVE
Nitrite, UA: NEGATIVE
Spec Grav, UA: 1.01 (ref 1.010–1.025)
Urobilinogen, UA: 0.2 E.U./dL
pH, UA: 7 (ref 5.0–8.0)

## 2020-11-22 NOTE — Progress Notes (Signed)
ROB: Patient has had limited prenatal care with this pregnancy.  Somewhat noncompliant with visits.  Not taking vitamins or aspirin as directed.  I have encouraged her to take this.  Patient presented to [facility] with complaint of elevated blood pressure.  Waited to be seen for 3 hours and left without being seen.  Explained that [facility] is our hospital and if she would like [place] to care for her she has to go to our hospital.  Stressed the importance of prenatal care and follow-ups.

## 2020-11-22 NOTE — Progress Notes (Signed)
ROB: She feels very tired, has concerns about her BP.

## 2020-11-27 ENCOUNTER — Encounter: Payer: Self-pay | Admitting: Obstetrics and Gynecology

## 2020-11-27 ENCOUNTER — Observation Stay
Admission: EM | Admit: 2020-11-27 | Discharge: 2020-11-27 | Disposition: A | Discharge status: Home / Self Care | Payer: Medicaid Other | Attending: Obstetrics and Gynecology | Admitting: Obstetrics and Gynecology

## 2020-11-27 ENCOUNTER — Other Ambulatory Visit: Payer: Self-pay

## 2020-11-27 DIAGNOSIS — O99323 Drug use complicating pregnancy, third trimester: Secondary | ICD-10-CM

## 2020-11-27 DIAGNOSIS — Z3A35 35 weeks gestation of pregnancy: Secondary | ICD-10-CM

## 2020-11-27 DIAGNOSIS — F1190 Opioid use, unspecified, uncomplicated: Secondary | ICD-10-CM

## 2020-11-27 DIAGNOSIS — F1590 Other stimulant use, unspecified, uncomplicated: Secondary | ICD-10-CM | POA: Diagnosis not present

## 2020-11-27 DIAGNOSIS — O4193X0 Disorder of amniotic fluid and membranes, unspecified, third trimester, not applicable or unspecified: Principal | ICD-10-CM | POA: Insufficient documentation

## 2020-11-27 DIAGNOSIS — Z3490 Encounter for supervision of normal pregnancy, unspecified, unspecified trimester: Secondary | ICD-10-CM

## 2020-11-27 LAB — URINALYSIS, ROUTINE W REFLEX MICROSCOPIC
Bilirubin Urine: NEGATIVE
Glucose, UA: NEGATIVE mg/dL
Ketones, ur: NEGATIVE mg/dL
Nitrite: NEGATIVE
Protein, ur: >=300 mg/dL — AB
Specific Gravity, Urine: 1.023 (ref 1.005–1.030)
pH: 5 (ref 5.0–8.0)

## 2020-11-27 LAB — URINE DRUG SCREEN, QUALITATIVE (ARMC ONLY)
Amphetamines, Ur Screen: POSITIVE — AB
Barbiturates, Ur Screen: NOT DETECTED
Benzodiazepine, Ur Scrn: NOT DETECTED
Cannabinoid 50 Ng, Ur ~~LOC~~: NOT DETECTED
Cocaine Metabolite,Ur ~~LOC~~: NOT DETECTED
MDMA (Ecstasy)Ur Screen: NOT DETECTED
Methadone Scn, Ur: NOT DETECTED
Opiate, Ur Screen: POSITIVE — AB
Phencyclidine (PCP) Ur S: NOT DETECTED
Tricyclic, Ur Screen: NOT DETECTED

## 2020-11-27 LAB — RUPTURE OF MEMBRANE (ROM)PLUS: Rom Plus: NEGATIVE

## 2020-11-27 NOTE — OB Triage Note (Signed)
G3P0 reports she was having sexual intercourse around 4:00p. Pt stated that she felt a "pop" and a "gush" of clear fluid. Pt report no bleeding. Pt stated that she is nausea, pt has not had any vomiting or diarrhea.

## 2020-11-27 NOTE — Final Progress Note (Signed)
L&D OB Triage Note HPI: Jessica Page is a 25 y.o. G39P0020 female at [redacted]w[redacted]d, EDD Estimated Date of Delivery: 12/31/20 who presented to triage for complaints of possible leaking of fluids around 4:00 pm after intercourse.  Notes that she felt a "pop" and a "gush".  She denies vaginal bleeding or contractions.  She was evaluated by the nurses with no significant findings for ruptured membranes. Vital signs stable. An NST was performed and has been reviewed by MD.   Physical Exam: Blood pressure 134/87, pulse 95, temperature 98.1 F (36.7 C), temperature source Oral, resp. rate 16, last menstrual period 03/31/2020. Cervical exam deferred  NST INTERPRETATION: Indications: rule out uterine contractions  Mode: External Baseline Rate (A): 135 bpm Variability: Moderate Accelerations: 15 x 15 Decelerations: None     Contraction Frequency (min): 7-8 (not detectable to patient)  Impression: reactive   Labs:  Results for orders placed or performed during the hospital encounter of 11/27/20  ROM Plus ([facility] only)  Result Value Ref Range   Rom Plus NEGATIVE   Urinalysis, Routine w reflex microscopic Urine, Clean Catch  Result Value Ref Range   Color, Urine YELLOW (A) YELLOW   APPearance HAZY (A) CLEAR   Specific Gravity, Urine 1.023 1.005 - 1.030   pH 5.0 5.0 - 8.0   Glucose, UA NEGATIVE NEGATIVE mg/dL   Hgb urine dipstick SMALL (A) NEGATIVE   Bilirubin Urine NEGATIVE NEGATIVE   Ketones, ur NEGATIVE NEGATIVE mg/dL   Protein, ur >=638 (A) NEGATIVE mg/dL   Nitrite NEGATIVE NEGATIVE   Leukocytes,Ua TRACE (A) NEGATIVE   RBC / HPF 0-5 0 - 5 RBC/hpf   WBC, UA 11-20 0 - 5 WBC/hpf   Bacteria, UA RARE (A) NONE SEEN   Squamous Epithelial / LPF 6-10 0 - 5   Mucus PRESENT     Plan: NST performed was reviewed and was found to be reactive. She was discharged home with preterm labor precautions.  Per nurse, concerns for possible substance use behavior noted from patient and partner. Will order UDS  and follow up outpatient.  Continue routine prenatal care. Follow up with OB/GYN as previously scheduled.     Hildred Laser, MD [facility]

## 2020-12-11 ENCOUNTER — Encounter: Payer: Medicaid Other | Admitting: Obstetrics and Gynecology

## 2020-12-11 DIAGNOSIS — Z3A37 37 weeks gestation of pregnancy: Secondary | ICD-10-CM

## 2020-12-11 DIAGNOSIS — Z3483 Encounter for supervision of other normal pregnancy, third trimester: Secondary | ICD-10-CM

## 2020-12-15 ENCOUNTER — Inpatient Hospital Stay (HOSPITAL_COMMUNITY)
Admission: AD | Admit: 2020-12-15 | Discharge: 2020-12-18 | DRG: 807 | Disposition: A | Discharge status: Home / Self Care | Payer: Medicaid Other | Attending: Obstetrics & Gynecology | Admitting: Obstetrics & Gynecology

## 2020-12-15 ENCOUNTER — Inpatient Hospital Stay (HOSPITAL_COMMUNITY): Payer: Medicaid Other | Admitting: Anesthesiology

## 2020-12-15 ENCOUNTER — Other Ambulatory Visit: Payer: Self-pay

## 2020-12-15 ENCOUNTER — Encounter (HOSPITAL_COMMUNITY): Payer: Self-pay | Admitting: Obstetrics and Gynecology

## 2020-12-15 DIAGNOSIS — O165 Unspecified maternal hypertension, complicating the puerperium: Secondary | ICD-10-CM

## 2020-12-15 DIAGNOSIS — O99320 Drug use complicating pregnancy, unspecified trimester: Secondary | ICD-10-CM

## 2020-12-15 DIAGNOSIS — O9902 Anemia complicating childbirth: Secondary | ICD-10-CM | POA: Diagnosis present

## 2020-12-15 DIAGNOSIS — O1493 Unspecified pre-eclampsia, third trimester: Secondary | ICD-10-CM | POA: Diagnosis present

## 2020-12-15 DIAGNOSIS — Z20822 Contact with and (suspected) exposure to covid-19: Secondary | ICD-10-CM | POA: Diagnosis present

## 2020-12-15 DIAGNOSIS — D509 Iron deficiency anemia, unspecified: Secondary | ICD-10-CM | POA: Diagnosis present

## 2020-12-15 DIAGNOSIS — Z23 Encounter for immunization: Secondary | ICD-10-CM

## 2020-12-15 DIAGNOSIS — O1404 Mild to moderate pre-eclampsia, complicating childbirth: Principal | ICD-10-CM | POA: Diagnosis present

## 2020-12-15 DIAGNOSIS — Z87891 Personal history of nicotine dependence: Secondary | ICD-10-CM | POA: Diagnosis not present

## 2020-12-15 DIAGNOSIS — O4292 Full-term premature rupture of membranes, unspecified as to length of time between rupture and onset of labor: Secondary | ICD-10-CM | POA: Diagnosis not present

## 2020-12-15 DIAGNOSIS — Z6791 Unspecified blood type, Rh negative: Secondary | ICD-10-CM

## 2020-12-15 DIAGNOSIS — F1510 Other stimulant abuse, uncomplicated: Secondary | ICD-10-CM | POA: Diagnosis present

## 2020-12-15 DIAGNOSIS — O26893 Other specified pregnancy related conditions, third trimester: Secondary | ICD-10-CM | POA: Diagnosis present

## 2020-12-15 DIAGNOSIS — O26899 Other specified pregnancy related conditions, unspecified trimester: Secondary | ICD-10-CM

## 2020-12-15 DIAGNOSIS — O99324 Drug use complicating childbirth: Secondary | ICD-10-CM | POA: Diagnosis present

## 2020-12-15 DIAGNOSIS — O0930 Supervision of pregnancy with insufficient antenatal care, unspecified trimester: Secondary | ICD-10-CM

## 2020-12-15 DIAGNOSIS — Z3A37 37 weeks gestation of pregnancy: Secondary | ICD-10-CM

## 2020-12-15 HISTORY — DX: Other specified health status: Z78.9

## 2020-12-15 LAB — COMPREHENSIVE METABOLIC PANEL
ALT: 21 U/L (ref 0–44)
AST: 29 U/L (ref 15–41)
Albumin: 1.8 g/dL — ABNORMAL LOW (ref 3.5–5.0)
Alkaline Phosphatase: 160 U/L — ABNORMAL HIGH (ref 38–126)
Anion gap: 10 (ref 5–15)
BUN: 7 mg/dL (ref 6–20)
CO2: 20 mmol/L — ABNORMAL LOW (ref 22–32)
Calcium: 8 mg/dL — ABNORMAL LOW (ref 8.9–10.3)
Chloride: 101 mmol/L (ref 98–111)
Creatinine, Ser: 0.99 mg/dL (ref 0.44–1.00)
GFR, Estimated: >60 mL/min (ref >60)
Glucose, Bld: 91 mg/dL (ref 70–99)
Potassium: 3.5 mmol/L (ref 3.5–5.1)
Sodium: 131 mmol/L — ABNORMAL LOW (ref 135–145)
Total Bilirubin: 0.4 mg/dL (ref 0.3–1.2)
Total Protein: 5 g/dL — ABNORMAL LOW (ref 6.5–8.1)

## 2020-12-15 LAB — CBC
HCT: 25.6 % — ABNORMAL LOW (ref 36.0–46.0)
HCT: 26.5 % — ABNORMAL LOW (ref 36.0–46.0)
Hemoglobin: 8 g/dL — ABNORMAL LOW (ref 12.0–15.0)
Hemoglobin: 8.2 g/dL — ABNORMAL LOW (ref 12.0–15.0)
MCH: 24.3 pg — ABNORMAL LOW (ref 26.0–34.0)
MCH: 24.4 pg — ABNORMAL LOW (ref 26.0–34.0)
MCHC: 31.3 g/dL (ref 30.0–36.0)
MCHC: 30.9 g/dL (ref 30.0–36.0)
MCV: 77.8 fL — ABNORMAL LOW (ref 80.0–100.0)
MCV: 78.9 fL — ABNORMAL LOW (ref 80.0–100.0)
Platelets: 343 10*3/uL (ref 150–400)
Platelets: 324 10*3/uL (ref 150–400)
RBC: 3.29 MIL/uL — ABNORMAL LOW (ref 3.87–5.11)
RBC: 3.36 MIL/uL — ABNORMAL LOW (ref 3.87–5.11)
RDW: 14.6 % (ref 11.5–15.5)
RDW: 14.8 % (ref 11.5–15.5)
WBC: 14.2 10*3/uL — ABNORMAL HIGH (ref 4.0–10.5)
WBC: 20.2 10*3/uL — ABNORMAL HIGH (ref 4.0–10.5)
nRBC: 0.6 % — ABNORMAL HIGH (ref 0.0–0.2)
nRBC: 0.6 % — ABNORMAL HIGH (ref 0.0–0.2)

## 2020-12-15 LAB — RESP PANEL BY RT-PCR (FLU A&B, COVID) ARPGX2
Influenza A by PCR: NEGATIVE
Influenza B by PCR: NEGATIVE
SARS Coronavirus 2 by RT PCR: NEGATIVE

## 2020-12-15 LAB — HEPATITIS B SURFACE ANTIGEN: Hepatitis B Surface Ag: NONREACTIVE

## 2020-12-15 LAB — RAPID URINE DRUG SCREEN, HOSP PERFORMED
Amphetamines: POSITIVE — AB
Barbiturates: NOT DETECTED
Benzodiazepines: NOT DETECTED
Cocaine: NOT DETECTED
Opiates: NOT DETECTED
Tetrahydrocannabinol: NOT DETECTED

## 2020-12-15 LAB — CREATININE, SERUM
Creatinine, Ser: 0.94 mg/dL (ref 0.44–1.00)
GFR, Estimated: >60 mL/min (ref >60)

## 2020-12-15 LAB — RPR: RPR Ser Ql: NONREACTIVE

## 2020-12-15 LAB — TYPE AND SCREEN
ABO/RH(D): A NEG
Antibody Screen: NEGATIVE

## 2020-12-15 LAB — PROTEIN / CREATININE RATIO, URINE
Creatinine, Urine: 55.72 mg/dL
Creatinine, Urine: 96.67 mg/dL
Protein Creatinine Ratio: 6.44 mg/mg{Cre} — ABNORMAL HIGH (ref 0.00–0.15)
Protein Creatinine Ratio: 10.09 mg/mg{Cre} — ABNORMAL HIGH (ref 0.00–0.15)
Total Protein, Urine: 359 mg/dL
Total Protein, Urine: 975 mg/dL

## 2020-12-15 LAB — POCT FERN TEST: POCT Fern Test: POSITIVE

## 2020-12-15 MED ORDER — SOD CITRATE-CITRIC ACID 500-334 MG/5ML PO SOLN: 30.0000 mL | ORAL | Status: DC | PRN

## 2020-12-15 MED ORDER — DIPHENHYDRAMINE HCL 25 MG PO CAPS: 25.0000 mg | ORAL_CAPSULE | Freq: Four times a day (QID) | ORAL | Status: DC | PRN

## 2020-12-15 MED ORDER — DIPHENHYDRAMINE HCL 50 MG/ML IJ SOLN: 12.5000 mg | INTRAMUSCULAR | Status: DC | PRN

## 2020-12-15 MED ORDER — EPHEDRINE 5 MG/ML INJ: 10.0000 mg | INTRAVENOUS | Status: DC | PRN

## 2020-12-15 MED ORDER — HYDROXYZINE HCL 25 MG PO TABS
50.0000 mg | ORAL_TABLET | Freq: Three times a day (TID) | ORAL | Status: DC | PRN
  Administered 2020-12-15: 50 mg via ORAL
  Filled 2020-12-15: qty 1

## 2020-12-15 MED ORDER — LACTATED RINGERS IV SOLN: 500.0000 mL | INTRAVENOUS | Status: DC | PRN

## 2020-12-15 MED ORDER — FENTANYL CITRATE (PF) 100 MCG/2ML IJ SOLN
50.0000 ug | INTRAMUSCULAR | Status: DC | PRN
  Administered 2020-12-15: 100 ug via INTRAVENOUS
  Filled 2020-12-15: qty 2

## 2020-12-15 MED ORDER — LACTATED RINGERS IV SOLN
INTRAVENOUS | Status: DC
  Administered 2020-12-15: 950 mL via INTRAVENOUS

## 2020-12-15 MED ORDER — ONDANSETRON HCL 4 MG/2ML IJ SOLN: 4.0000 mg | Freq: Four times a day (QID) | INTRAMUSCULAR | Status: DC | PRN

## 2020-12-15 MED ORDER — IBUPROFEN 600 MG PO TABS
600.0000 mg | ORAL_TABLET | Freq: Four times a day (QID) | ORAL | Status: DC
  Administered 2020-12-15 – 2020-12-18 (×11): 600 mg via ORAL
  Filled 2020-12-15 (×11): qty 1

## 2020-12-15 MED ORDER — BENZOCAINE-MENTHOL 20-0.5 % EX AERO
1.0000 "application " | INHALATION_SPRAY | CUTANEOUS | Status: DC | PRN
  Administered 2020-12-16: 1 via TOPICAL
  Filled 2020-12-15: qty 56

## 2020-12-15 MED ORDER — ZOLPIDEM TARTRATE 5 MG PO TABS: 5.0000 mg | ORAL_TABLET | Freq: Every evening | ORAL | Status: DC | PRN

## 2020-12-15 MED ORDER — PHENYLEPHRINE 40 MCG/ML (10ML) SYRINGE FOR IV PUSH (FOR BLOOD PRESSURE SUPPORT): 80.0000 ug | PREFILLED_SYRINGE | INTRAVENOUS | Status: DC | PRN

## 2020-12-15 MED ORDER — ACETAMINOPHEN 325 MG PO TABS: 650.0000 mg | ORAL_TABLET | ORAL | Status: DC | PRN

## 2020-12-15 MED ORDER — ACETAMINOPHEN 325 MG PO TABS
650.0000 mg | ORAL_TABLET | ORAL | Status: DC | PRN
  Administered 2020-12-17: 650 mg via ORAL
  Filled 2020-12-15: qty 2

## 2020-12-15 MED ORDER — OXYTOCIN BOLUS FROM INFUSION
333.0000 mL | Freq: Once | INTRAVENOUS | Status: AC
  Administered 2020-12-15: 333 mL via INTRAVENOUS

## 2020-12-15 MED ORDER — WITCH HAZEL-GLYCERIN EX PADS: 1.0000 "application " | MEDICATED_PAD | CUTANEOUS | Status: DC | PRN

## 2020-12-15 MED ORDER — SENNOSIDES-DOCUSATE SODIUM 8.6-50 MG PO TABS
2.0000 | ORAL_TABLET | Freq: Every day | ORAL | Status: DC
  Administered 2020-12-16: 2 via ORAL
  Filled 2020-12-15 (×3): qty 2

## 2020-12-15 MED ORDER — DIBUCAINE (PERIANAL) 1 % EX OINT: 1.0000 "application " | TOPICAL_OINTMENT | CUTANEOUS | Status: DC | PRN

## 2020-12-15 MED ORDER — LIDOCAINE HCL (PF) 1 % IJ SOLN
INTRAMUSCULAR | Status: DC | PRN
  Administered 2020-12-15: 10 mL via EPIDURAL

## 2020-12-15 MED ORDER — FENTANYL-BUPIVACAINE-NACL 0.5-0.125-0.9 MG/250ML-% EP SOLN
EPIDURAL | Status: AC
  Filled 2020-12-15: qty 250

## 2020-12-15 MED ORDER — SIMETHICONE 80 MG PO CHEW: 80.0000 mg | CHEWABLE_TABLET | ORAL | Status: DC | PRN

## 2020-12-15 MED ORDER — OXYTOCIN-SODIUM CHLORIDE 30-0.9 UT/500ML-% IV SOLN
2.5000 [IU]/h | INTRAVENOUS | Status: DC
  Administered 2020-12-15: 2.5 [IU]/h via INTRAVENOUS
  Filled 2020-12-15: qty 500

## 2020-12-15 MED ORDER — FENTANYL-BUPIVACAINE-NACL 0.5-0.125-0.9 MG/250ML-% EP SOLN
12.0000 mL/h | EPIDURAL | Status: DC | PRN
  Administered 2020-12-15: 12 mL/h via EPIDURAL

## 2020-12-15 MED ORDER — COCONUT OIL OIL: 1.0000 "application " | TOPICAL_OIL | Status: DC | PRN

## 2020-12-15 MED ORDER — TETANUS-DIPHTH-ACELL PERTUSSIS 5-2.5-18.5 LF-MCG/0.5 IM SUSY: 0.5000 mL | PREFILLED_SYRINGE | Freq: Once | INTRAMUSCULAR | Status: DC

## 2020-12-15 MED ORDER — ONDANSETRON HCL 4 MG/2ML IJ SOLN: 4.0000 mg | INTRAMUSCULAR | Status: DC | PRN

## 2020-12-15 MED ORDER — INFLUENZA VAC SPLIT QUAD 0.5 ML IM SUSY
0.5000 mL | PREFILLED_SYRINGE | INTRAMUSCULAR | Status: AC
  Administered 2020-12-17: 0.5 mL via INTRAMUSCULAR
  Filled 2020-12-15: qty 0.5

## 2020-12-15 MED ORDER — LACTATED RINGERS IV SOLN
500.0000 mL | Freq: Once | INTRAVENOUS | Status: AC
  Administered 2020-12-15: 500 mL via INTRAVENOUS

## 2020-12-15 MED ORDER — PRENATAL MULTIVITAMIN CH
1.0000 | ORAL_TABLET | Freq: Every day | ORAL | Status: DC
  Administered 2020-12-16 – 2020-12-18 (×3): 1 via ORAL
  Filled 2020-12-15 (×3): qty 1

## 2020-12-15 MED ORDER — LIDOCAINE HCL (PF) 1 % IJ SOLN: 30.0000 mL | INTRAMUSCULAR | Status: DC | PRN

## 2020-12-15 MED ORDER — NIFEDIPINE ER OSMOTIC RELEASE 30 MG PO TB24
30.0000 mg | ORAL_TABLET | Freq: Every day | ORAL | Status: DC
  Administered 2020-12-15: 30 mg via ORAL
  Filled 2020-12-15 (×3): qty 1

## 2020-12-15 MED ORDER — ONDANSETRON HCL 4 MG PO TABS: 4.0000 mg | ORAL_TABLET | ORAL | Status: DC | PRN

## 2020-12-15 NOTE — Plan of Care (Signed)

## 2020-12-15 NOTE — H&P (Signed)
OBSTETRIC ADMISSION HISTORY AND PHYSICAL  Jessica Page is a 25 y.o. female G3P0020 with IUP at [redacted]w[redacted]d by 16.2 wks U/S presenting for SROM and contractions.   Reports fetal movement. Denies vaginal bleeding.  She received her prenatal care at  [facility] .  Support person in labor: FOB  Ultrasounds Anatomy U/S:  Narrative & Impression  ----------------------------------------------------------------------  OBSTETRICS REPORT                       (Signed Final 09/19/2020 05:14 pm) ---------------------------------------------------------------------- Patient Info  ID #:       [id number]                          D.O.B.:  Nov 28, 1995 (24 yrs)  Name:       Jessica Page                   Visit Date: 09/19/2020 04:15 pm ---------------------------------------------------------------------- Performed By  Attending:        Ma Rings MD         Ref. Address:     [facility]                                                             [address]                                                             [address]                                                             [address]                                                             [place]                                                             [address]  Performed By:     Lorn Junes,           Location[facility]         [facility]                    RDMS, RDCS                               Fetal [facility] at                                                             [facility]  Referred By:      Linzie Collin MD ---------------------------------------------------------------------- Orders  #  Description                           Code        Ordered By  1  [place] MFM OB DETAIL +14 WK               76811.01    DAVID EVANS ----------------------------------------------------------------------  #  Order #                      Accession #                Episode #  1  [id number]                   [id number]                 [id number] ---------------------------------------------------------------------- Indications  Insufficient Prenatal Care                     O09.30  Encounter for antenatal screening for          Z36.3  malformations  [redacted] weeks gestation of pregnancy                Z3A.25  Low 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 Cord Insertion:      Visualized, central  Amniotic Fluid  AFI FV:      Within normal limits                              Largest Pocket(cm)                              6 ---------------------------------------------------------------------- Biometry  BPD:      62.1  mm     G. Age:  25w 1d         38  %    CI:        73.36   %    70 - 86  FL/HC:      19.4   %    18.7 - 20.3  HC:      230.4  mm     G. Age:  25w 0d         21  %    HC/AC:      1.06        1.04 - 1.22  AC:      218.2  mm     G. Age:  26w 2d         72  %    FL/BPD:     72.0   %    71 - 87  FL:       44.7  mm     G. Age:  24w 5d         21  %    FL/AC:      20.5   %    20 - 24  HUM:      43.7  mm     G. Age:  26w 0d         63  %  CER:      29.3  mm     G. Age:  25w 5d         62  %  LV:        4.7  mm  CM:        3.9  mm  Est. FW:     824  gm    1 lb 13 oz      51  % ---------------------------------------------------------------------- OB History  Gravidity:    3          SAB:   0  Living:       0 ---------------------------------------------------------------------- Gestational Age  U/S Today:     25w 2d                                        EDD:   12/31/20  Best:          25w 2d     Det. By:  Marcella Dubs         EDD:   12/31/20                                      (07/18/20) ---------------------------------------------------------------------- Anatomy  Cranium:               Appears normal         Aortic Arch:            Appears normal  Cavum:                 Appears normal         Ductal Arch:            Appears normal  Ventricles:            Appears normal         Diaphragm:              Appears normal  Choroid Plexus:        Appears normal         Stomach:                Appears normal, left  sided  Cerebellum:            Appears normal         Abdomen:                Appears normal  Posterior Fossa:       Appears normal         Abdominal Wall:         Appears nml (cord                                                                        insert, abd wall)  Nuchal Fold:           Appears normal         Cord Vessels:           Appears normal (3                                                                        vessel cord)  Face:                  Appears normal         Kidneys:                Appear normal                         (orbits and profile)  Lips:                  Appears normal         Bladder:                Appears normal  Thoracic:              Appears normal         Spine:                  Appears normal  Heart:                 Appears normal         Upper Extremities:      Appears normal                        (4CH, axis, and                         situs)  RVOT:                  Appears normal         Lower Extremities:      Appears normal  LVOT:                  Appears normal  Other:  Fetus appears to be female. ---------------------------------------------------------------------- Targeted Anatomy  Thorax  SVC:  Appears normal         IVC:                    Appears normal  Other  Genitalia:             Normal  Female ---------------------------------------------------------------------- Cervix Uterus Adnexa  Cervix  Length:           3.83  cm.  Normal appearance by transabdominal scan.  Right Ovary  Within normal limits.  Left Ovary  Within normal limits. ---------------------------------------------------------------------- Comments  This patient was seen for a detailed fetal anatomy scan as  she presented late for prenatal care.  A possible placenta  previa was noted on an ultrasound exam earlier in her  pregnancy.  She denies any significant past medical history and denies  any problems in her current pregnancy.  She had a cell free DNA test earlier in her pregnancy which  indicated a low risk for trisomy 57, 70, and 13. A female fetus  is predicted.  She was informed that the fetal growth and amniotic fluid  level were appropriate for her gestational age.    There were no obvious fetal anomalies noted on today's  ultrasound exam.  The patient was informed that anomalies may be missed due  to technical limitations. If the fetus is in a suboptimal position  or maternal habitus is increased, visualization of the fetus in  the maternal uterus may be impaired.  There were no signs of placenta previa noted on today's  ultrasound exam.  Follow up as indicated. ----------------------------------------------------------------------                   Ma Rings, MD Electronically Signed Final Report   09/19/2020 05:14 pm ----------------------------------------------------------------------   Prenatal History/Complications: Methamphetamine abuse (current) Iron deficiency anemia Rh Negative Limited Prenatal care  Past Medical History: Past Medical History:  Diagnosis Date   Known health problems: none    Medical history non-contributory     Past Surgical History: Past Surgical History:  Procedure Laterality Date   TONSILLECTOMY AND ADENOIDECTOMY      Obstetrical  History: OB History     Gravida  3   Para      Term      Preterm      AB  2   Living         SAB  2   IAB      Ectopic      Multiple      Live Births              Social History: Social History   Socioeconomic History   Marital status: Single    Spouse name: Not on file   Number of children: Not on file   Years of education: Not on file   Highest education level: Not on file  Occupational History   Not on file  Tobacco Use   Smoking status: Former    Packs/day: 0.10    Types: Cigarettes   Smokeless tobacco: Never  Vaping Use   Vaping Use: Former  Substance and Sexual Activity   Alcohol use: Not Currently   Drug use: Not Currently    Types: Marijuana    Comment: last "hit" was at end of "July"   Sexual activity: Yes  Other Topics Concern   Not on file  Social History Narrative   Not on file   Social Determinants of Health   Financial Resource Strain: Not  on file  Food Insecurity: Not on file  Transportation Needs: Not on file  Physical Activity: Not on file  Stress: Not on file  Social Connections: Not on file    Family History: Family History  Problem Relation Age of Onset   Healthy Mother    Healthy Father     Allergies: Allergies  Allergen Reactions   Bromelains     Cough Syrup   Codeine Diarrhea   Amoxicillin Rash   Penicillins Rash    Medications Prior to Admission  Medication Sig Dispense Refill Last Dose   Prenatal Vit-Fe Fumarate-FA (MULTIVITAMIN-PRENATAL) 27-0.8 MG TABS tablet Take 1 tablet by mouth daily at 12 noon.   Unknown     Review of Systems  All systems reviewed and negative except as stated in HPI  Blood pressure (!) 146/103, pulse (!) 127, temperature 97.7 F (36.5 C), temperature source Oral, resp. rate 18, height  (1.702 m), weight 85.8 kg, last menstrual period 03/31/2020, SpO2 100 %. General appearance: alert, appears older than stated age, fatigued, and moderate distress Lungs: no  respiratory distress Heart: regular rate  Abdomen: soft, non-tender; gravid  Pelvic: adequate Extremities: Homans sign is negative, no sign of DVT Presentation: cephalic Fetal monitoring: 145 bpm / moderate variability / accels present / early decels present  Uterine activity: regular every 2-2.5 mins  Dilation: 6 Effacement (%): 90 Station: -1 Exam by:: Arita Miss, CNM BBOW  Prenatal labs: ABO, Rh: --/--/A NEG (11/11 0902) Antibody: NEG (11/11 0902) Rubella: <0.90 (07/07 1510) RPR: Non Reactive (09/02 1458)  HBsAg:   HIV: Non Reactive (07/07 1510)  GBS:  Unknown Glucola: Normal 1 hr Genetic screening:  Negative (MaterniT21)  Prenatal Transfer Tool  Maternal Diabetes: No Genetic Screening: Normal Maternal Ultrasounds/Referrals: Normal Fetal Ultrasounds or other Referrals:  None Maternal Substance Abuse:  Yes:  Type: Other: Methamphetamine Significant Maternal Medications:  None Significant Maternal Lab Results: Other: GBS Unknown  Results for orders placed or performed during the hospital encounter of 12/15/20 (from the past 24 hour(s))  Fern Test   Collection Time: 12/15/20  8:59 AM  Result Value Ref Range   POCT Fern Test Positive = ruptured amniotic membanes   Urine rapid drug screen (hosp performed)   Collection Time: 12/15/20  9:01 AM  Result Value Ref Range   Opiates NONE DETECTED NONE DETECTED   Cocaine NONE DETECTED NONE DETECTED   Benzodiazepines NONE DETECTED NONE DETECTED   Amphetamines POSITIVE (A) NONE DETECTED   Tetrahydrocannabinol NONE DETECTED NONE DETECTED   Barbiturates NONE DETECTED NONE DETECTED  CBC   Collection Time: 12/15/20  9:02 AM  Result Value Ref Range   WBC 14.2 (H) 4.0 - 10.5 K/uL   RBC 3.29 (L) 3.87 - 5.11 MIL/uL   Hemoglobin 8.0 (L) 12.0 - 15.0 g/dL   HCT 40.9 (L) 81.1 - 91.4 %   MCV 77.8 (L) 80.0 - 100.0 fL   MCH 24.3 (L) 26.0 - 34.0 pg   MCHC 31.3 30.0 - 36.0 g/dL   RDW 78.2 95.6 - 21.3 %   Platelets 343 150 - 400 K/uL   nRBC  0.6 (H) 0.0 - 0.2 %  Comprehensive metabolic panel   Collection Time: 12/15/20  9:02 AM  Result Value Ref Range   Sodium 131 (L) 135 - 145 mmol/L   Potassium 3.5 3.5 - 5.1 mmol/L   Chloride 101 98 - 111 mmol/L   CO2 20 (L) 22 - 32 mmol/L   Glucose, Bld 91 70 - 99 mg/dL  BUN 7 6 - 20 mg/dL   Creatinine, Ser 9.74 0.44 - 1.00 mg/dL   Calcium 8.0 (L) 8.9 - 10.3 mg/dL   Total Protein 5.0 (L) 6.5 - 8.1 g/dL   Albumin 1.8 (L) 3.5 - 5.0 g/dL   AST 29 15 - 41 U/L   ALT 21 0 - 44 U/L   Alkaline Phosphatase 160 (H) 38 - 126 U/L   Total Bilirubin 0.4 0.3 - 1.2 mg/dL   GFR, Estimated >16 >38 mL/min   Anion gap 10 5 - 15  Type and screen [facility]   Collection Time: 12/15/20  9:02 AM  Result Value Ref Range   ABO/RH(D) A NEG    Antibody Screen NEG    Sample Expiration      12/18/2020,2359 Performed at [facility], [address] [place], [place] [address]     Patient Active Problem List   Diagnosis Date Noted   Limited prenatal care 12/15/2020   Elevated blood pressure affecting pregnancy in third trimester, antepartum 12/15/2020   Indication for care in labor or delivery 12/15/2020   Iron deficiency anemia 12/15/2020   Indication for care in labor and delivery, antepartum 11/27/2020   Pregnancy 11/27/2020   Rh negative state in antepartum period 10/06/2020   Known health problems: none 09/21/2020   Liver laceration, closed 01/28/2020    Assessment/Plan:  Rhiyanna Fiegel is a 25 y.o. G3P0020 at [redacted]w[redacted]d here for SROM and contractions  Labor: active -- pain control: planning an epidural  Fetal Wellbeing: EFW 6.5 lbs by Leopold's. Cephalic by SVE.  -- GBS (unk) -- continuous fetal monitoring - Category 1   Postpartum Planning -- bottle -- Depo   Raelyn Mora, CNM  12/15/2020, 10:13 AM

## 2020-12-15 NOTE — Progress Notes (Signed)
Jessica Page is a 25 y.o. G3P0020 at [redacted]w[redacted]d by ultrasound admitted for active labor, rupture of membranes  Subjective: Patient more comfortable with epidural in place. Feeling pressure , "like I have to poop."  Objective: BP (!) 140/93   Pulse 88   Temp 97.7 F (36.5 C) (Oral)   Resp 18   Ht 5\' 7"  (1.702 m)   Wt 85.8 kg   LMP 03/31/2020 (Exact Date)   SpO2 100%   BMI 29.63 kg/m  No intake/output data recorded. No intake/output data recorded.  FHT:  FHR: 135 bpm, variability: moderate,  accelerations:  Present,  decelerations:  Absent UC:   regular, every 1.5-3 minutes SVE:   Dilation: 7 Effacement (%): 100 Station: 0 Exam by:: [email or web address], CNM AROM with small amount of thin, light meconium stained fluid in return. Patient tolerated procedure well  Labs: Lab Results  Component Value Date   WBC 14.2 (H) 12/15/2020   HGB 8.0 (L) 12/15/2020   HCT 25.6 (L) 12/15/2020   MCV 77.8 (L) 12/15/2020   PLT 343 12/15/2020    Assessment / Plan: Spontaneous labor, progressing normally  Labor: Progressing normally Preeclampsia:  no signs or symptoms of toxicity - P/C ratio = 6.44 Fetal Wellbeing:  Category I Pain Control:  Epidural I/D:   GBS Unknown Anticipated MOD:  NSVD  13/12/2020, CNM 12/15/2020, 12:45 PM

## 2020-12-15 NOTE — Anesthesia Procedure Notes (Signed)
Epidural Patient location during procedure: OB Start time: 12/15/2020 10:18 AM End time: 12/15/2020 10:26 AM  Staffing Anesthesiologist: Lucretia Kern, MD Performed: anesthesiologist   Preanesthetic Checklist Completed: patient identified, IV checked, risks and benefits discussed, monitors and equipment checked, pre-op evaluation and timeout performed  Epidural Patient position: sitting Prep: DuraPrep Patient monitoring: heart rate, continuous pulse ox and blood pressure Approach: midline Location: L3-L4 Injection technique: LOR air  Needle:  Needle type: Tuohy  Needle gauge: 17 G Needle length: 9 cm Needle insertion depth: 5 cm Catheter type: closed end flexible Catheter size: 19 Gauge Catheter at skin depth: 10 cm Test dose: negative  Assessment Events: blood not aspirated, injection not painful, no injection resistance, no paresthesia and negative IV test  Additional Notes Reason for block:procedure for pain

## 2020-12-15 NOTE — Discharge Summary (Signed)
Postpartum Discharge Summary   Patient Name: Jessica Page DOB: Nov 21, 1995 MRN: [id number]  Date of admission: 12/15/2020 Delivery date:12/15/2020  Delivering provider: Laury Deep  Date of discharge: 12/18/2020  Admitting diagnosis: Indication for care in labor or delivery [O75.9] Intrauterine pregnancy: [redacted]w[redacted]d     Secondary diagnosis:  Active Problems:   Rh negative state in antepartum period   Indication for care in labor and delivery, antepartum   Limited prenatal care   Preeclampsia, third trimester   Iron deficiency anemia   Postpartum hypertension   Drug use affecting pregnancy  Additional problems: Substance Abuse Disorder    Discharge diagnosis: Term Pregnancy Delivered and Preeclampsia (mild)                                              Post partum procedures: None Augmentation: AROM Complications: None  Hospital course: Onset of Labor With Vaginal Delivery      25 y.o. yo G3P0020 at [redacted]w[redacted]d was admitted in Active Labor on 12/15/2020. Patient had an uncomplicated labor course as follows:  Membrane Rupture Time/Date: 7:00 AM ,12/15/2020   Delivery Method:Vaginal, Spontaneous  Episiotomy: None  Lacerations:  None  Patient had a postpartum course complicated by continued elevated BP's 130-150's/100's. Procardia 30XL BID added, Lisinopril 5 mg added, increased to $RemoveBefo'10mg'UBIbvbNRFAX$  at discharge. Lasix $RemoveBeforeDE'20mg'WzrhFCYQwnBzzyl$  BID for total of 5 days (2+ pitting b/l). Denies HA, vision changes or epigastric pain. CBC and CMET rechecked due to continued elevated BPs, anemia and elevated WBC's. Creatinine slightly elevated at 1.05, but minimal change from baseline of 0.99. Does not meet criteria for Pre-E w/ severe features. WBC's decreased 20 to 12. Hgb 8.0 to 7.6. Stable. Pt asymptomatic. Continue PO Iron   SW and CPS evaluated for Hx SUD. See notes. No withdrawal symptoms.  She is ambulating, tolerating a regular diet, passing flatus, and urinating well. Patient is discharged home in stable condition on  12/18/20.  Newborn Data: Birth date:12/15/2020  Birth time:7:49 PM  Gender:Female  Living status:Living  Apgars:7 ,8  Weight:2948 g   Magnesium Sulfate received: No BMZ received: No Rhophylac:Yes MMR:N/A T-DaP: unknown Flu: N/A Transfusion:No  Physical exam  Vitals:   12/17/20 2008 12/17/20 2312 12/18/20 0704 12/18/20 1006  BP: (!) 141/99 136/88 (!) 144/99 134/87  Pulse: 97  (!) 108 (!) 105  Resp: 18     Temp: 97.7 F (36.5 C)  98 F (36.7 C)   TempSrc: Axillary     SpO2: 100% 98% 99%   Weight:      Height:       General: alert, cooperative, and no distress Lochia: appropriate Uterine Fundus: firm Incision: N/A DVT Evaluation: 2+ pitting edema to level of upper shin bilaterally and equally without tenderness or skin changes  Negative Homan's sign. Labs:  CBC Latest Ref Rng & Units 12/17/2020 12/15/2020 12/15/2020  WBC 4.0 - 10.5 K/uL 12.6(H) 20.2(H) 14.2(H)  Hemoglobin 12.0 - 15.0 g/dL 7.6(L) 8.2(L) 8.0(L)  Hematocrit 36.0 - 46.0 % 25.7(L) 26.5(L) 25.6(L)  Platelets 150 - 400 K/uL 388 324 343    CMP Latest Ref Rng & Units 12/17/2020 12/15/2020 12/15/2020  Glucose 70 - 99 mg/dL 80 - 91  BUN 6 - 20 mg/dL 7 - 7  Creatinine 0.44 - 1.00 mg/dL 1.05(H) 0.94 0.99  Sodium 135 - 145 mmol/L 137 - 131(L)  Potassium 3.5 - 5.1 mmol/L 4.1 - 3.5  Chloride 98 - 111 mmol/L 106 - 101  CO2 22 - 32 mmol/L 24 - 20(L)  Calcium 8.9 - 10.3 mg/dL 8.1(L) - 8.0(L)  Total Protein 6.5 - 8.1 g/dL 5.2(L) - 5.0(L)  Total Bilirubin 0.3 - 1.2 mg/dL 0.4 - 0.4  Alkaline Phos 38 - 126 U/L 115 - 160(H)  AST 15 - 41 U/L 33 - 29  ALT 0 - 44 U/L 21 - 21    Edinburgh Score: Edinburgh Postnatal Depression Scale Screening Tool 12/18/2020  I have been able to laugh and see the funny side of things. 0  I have looked forward with enjoyment to things. 0  I have blamed myself unnecessarily when things went wrong. 1  I have been anxious or worried for no good reason. 0  I have felt scared or  panicky for no good reason. 0  Things have been getting on top of me. 1  I have been so unhappy that I have had difficulty sleeping. 0  I have felt sad or miserable. 0  I have been so unhappy that I have been crying. 0  The thought of harming myself has occurred to me. 0  Edinburgh Postnatal Depression Scale Total 2     After visit meds:  Allergies as of 12/18/2020       Reactions   Bromelains    Cough Syrup   Codeine Diarrhea   Amoxicillin Rash   Penicillins Rash        Medication List     TAKE these medications    acetaminophen 325 MG tablet Commonly known as: Tylenol Take 2 tablets (650 mg total) by mouth every 4 (four) hours as needed (for pain scale < 4).   ferrous sulfate 325 (65 FE) MG tablet Take 1 tablet (325 mg total) by mouth every other day.   furosemide 20 MG tablet Commonly known as: LASIX Take 1 tablet (20 mg total) by mouth 2 (two) times daily for 3 days.   ibuprofen 600 MG tablet Commonly known as: ADVIL Take 1 tablet (600 mg total) by mouth every 6 (six) hours.   lisinopril 10 MG tablet Commonly known as: ZESTRIL Take 1 tablet (10 mg total) by mouth daily.   multivitamin-prenatal 27-0.8 MG Tabs tablet Take 1 tablet by mouth daily at 12 noon.   NIFEdipine 30 MG 24 hr tablet Commonly known as: ADALAT CC Take 1 tablet (30 mg total) by mouth 2 (two) times daily.         Discharge home in stable condition Infant Feeding: Bottle Infant Disposition:home with mother Discharge instruction: per After Visit Summary and Postpartum booklet. Activity: Advance as tolerated. Pelvic rest for 6 weeks.  Diet: Iron rich diet Future Appointments: Future Appointments  Date Time Provider [place]  12/20/2020  8:15 AM Harlin Heys, MD [facility] None  01/26/2021 10:45 AM Rubie Maid, MD [facility] None    Message sent to [facility] by Laury Deep, CNM on 12/15/20 Message additionally sent to [facility] on 11/14 by Dr Higinio Plan on day  of discharge d/t patient request to switch care.   Please schedule this patient for a In person postpartum visit in 6 weeks with the following provider: Any provider. Additional Postpartum F/U:BP check 1 week  High risk pregnancy complicated by:  Substance Abuse Disorder Delivery mode:  Vaginal, Spontaneous  Anticipated Birth Control:  Depo  12/18/2020 Patriciaann Clan, DO

## 2020-12-15 NOTE — MAU Note (Signed)
Presents via EMS for c/o ctxs since 0400 and LOF since approx 0700.  Denies VB.  Endorses +FM.

## 2020-12-15 NOTE — Anesthesia Preprocedure Evaluation (Signed)
Anesthesia Evaluation  Patient identified by MRN, date of birth, ID band Patient awake    Reviewed: Allergy & Precautions, H&P , NPO status , Patient's Chart, lab work & pertinent test results  History of Anesthesia Complications Negative for: history of anesthetic complications  Airway Mallampati: II  TM Distance: >3 FB     Dental   Pulmonary neg pulmonary ROS, former smoker,    Pulmonary exam normal        Cardiovascular negative cardio ROS   Rhythm:regular Rate:Normal     Neuro/Psych negative neurological ROS  negative psych ROS   GI/Hepatic negative GI ROS, (+)     substance abuse  methamphetamine use,   Endo/Other  negative endocrine ROS  Renal/GU negative Renal ROS  negative genitourinary   Musculoskeletal   Abdominal   Peds  Hematology  (+) Blood dyscrasia, anemia ,   Anesthesia Other Findings   Reproductive/Obstetrics (+) Pregnancy                             Anesthesia Physical Anesthesia Plan  ASA: 2  Anesthesia Plan: Epidural   Post-op Pain Management:    Induction:   PONV Risk Score and Plan:   Airway Management Planned:   Additional Equipment:   Intra-op Plan:   Post-operative Plan:   Informed Consent: I have reviewed the patients History and Physical, chart, labs and discussed the procedure including the risks, benefits and alternatives for the proposed anesthesia with the patient or authorized representative who has indicated his/her understanding and acceptance.       Plan Discussed with:   Anesthesia Plan Comments:         Anesthesia Quick Evaluation

## 2020-12-16 MED ORDER — LISINOPRIL 5 MG PO TABS
5.0000 mg | ORAL_TABLET | Freq: Every day | ORAL | Status: DC
  Administered 2020-12-16 – 2020-12-18 (×3): 5 mg via ORAL
  Filled 2020-12-16 (×3): qty 1

## 2020-12-16 MED ORDER — RHO D IMMUNE GLOBULIN 1500 UNIT/2ML IJ SOSY
300.0000 ug | PREFILLED_SYRINGE | Freq: Once | INTRAMUSCULAR | Status: AC
Start: 2020-12-16 — End: 2020-12-16
  Administered 2020-12-16: 300 ug via INTRAVENOUS
  Filled 2020-12-16: qty 2

## 2020-12-16 MED ORDER — FUROSEMIDE 20 MG PO TABS
20.0000 mg | ORAL_TABLET | Freq: Two times a day (BID) | ORAL | Status: DC
  Administered 2020-12-16 – 2020-12-18 (×5): 20 mg via ORAL
  Filled 2020-12-16 (×7): qty 1

## 2020-12-16 MED ORDER — FERROUS SULFATE 325 (65 FE) MG PO TABS
325.0000 mg | ORAL_TABLET | ORAL | Status: DC
  Administered 2020-12-16 – 2020-12-18 (×2): 325 mg via ORAL
  Filled 2020-12-16 (×2): qty 1

## 2020-12-16 MED ORDER — NIFEDIPINE ER OSMOTIC RELEASE 30 MG PO TB24
30.0000 mg | ORAL_TABLET | Freq: Two times a day (BID) | ORAL | Status: DC
  Administered 2020-12-16 – 2020-12-18 (×5): 30 mg via ORAL
  Filled 2020-12-16 (×4): qty 1

## 2020-12-16 NOTE — Progress Notes (Signed)
Call placed to Dr Annia Friendly, notified last 2 B/P's this am 143/100, repeat in 1 hour 135/97, denies H/A or blurry vision. Petal edema 2+ pitting. Orders received for Lasix now and Procardia 30 XL increased to twice a day. Patient aware of plan of care and verbalizes understanding

## 2020-12-16 NOTE — Progress Notes (Signed)
Post Partum Day 1 Subjective: Doing well. No acute events overnight. Resting comfortably this AM. Pain is controlled and bleeding is appropriate. She is eating, drinking, voiding, and ambulating without issue. She is formula feeding which is going well. She has no other concerns at this time.  Objective: Blood pressure (!) 141/97, pulse 78, temperature 98.4 F (36.9 C), temperature source Oral, resp. rate 18, height 5\' 7"  (1.702 m), weight 85.8 kg, last menstrual period 03/31/2020, SpO2 98 %, unknown if currently breastfeeding.  Physical Exam:  General: alert, cooperative, and no distress Lochia: appropriate Uterine Fundus: firm and below umbilicus, nontender DVT Evaluation: no LE edema or calf tenderness to palpation  Recent Labs    12/15/20 0902 12/15/20 2204  HGB 8.0* 8.2*  HCT 25.6* 26.5*    Assessment/Plan: Jessica Page is a 25 y.o. G3P1021 on PPD#1 s/p SVD.  Progressing well. Meeting postpartum milestones. Continue routine postpartum care.  

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
## 2020-12-16 NOTE — Anesthesia Postprocedure Evaluation (Signed)
Anesthesia Post Note  Patient: Jessica Page  Procedure(s) Performed: AN AD HOC LABOR EPIDURAL     Patient location during evaluation: Mother Baby Anesthesia Type: Epidural Level of consciousness: awake and alert Pain management: pain level controlled Vital Signs Assessment: post-procedure vital signs reviewed and stable Respiratory status: spontaneous breathing, nonlabored ventilation and respiratory function stable Cardiovascular status: stable Postop Assessment: no headache, no backache and epidural receding Anesthetic complications: no   No notable events documented.  Last Vitals:  Vitals:   12/16/20 0740 12/16/20 0900  BP: (!) 143/100 (!) 135/97  Pulse: 83 97  Resp: 16   Temp: 36.8 C   SpO2: 98%     Last Pain:  Vitals:   12/16/20 0740  TempSrc: Oral  PainSc: 0-No pain   Pain Goal:                   [place]

## 2020-12-17 ENCOUNTER — Encounter (HOSPITAL_COMMUNITY): Payer: Self-pay | Admitting: Obstetrics and Gynecology

## 2020-12-17 LAB — CBC WITH DIFFERENTIAL/PLATELET
Abs Immature Granulocytes: 0.23 10*3/uL — ABNORMAL HIGH (ref 0.00–0.07)
Basophils Absolute: 0.1 10*3/uL (ref 0.0–0.1)
Basophils Relative: 0 %
Eosinophils Absolute: 0.1 10*3/uL (ref 0.0–0.5)
Eosinophils Relative: 1 %
HCT: 25.7 % — ABNORMAL LOW (ref 36.0–46.0)
Hemoglobin: 7.6 g/dL — ABNORMAL LOW (ref 12.0–15.0)
Immature Granulocytes: 2 %
Lymphocytes Relative: 24 %
Lymphs Abs: 3 10*3/uL (ref 0.7–4.0)
MCH: 24 pg — ABNORMAL LOW (ref 26.0–34.0)
MCHC: 29.6 g/dL — ABNORMAL LOW (ref 30.0–36.0)
MCV: 81.1 fL (ref 80.0–100.0)
Monocytes Absolute: 1.1 10*3/uL — ABNORMAL HIGH (ref 0.1–1.0)
Monocytes Relative: 9 %
Neutro Abs: 8.2 10*3/uL — ABNORMAL HIGH (ref 1.7–7.7)
Neutrophils Relative %: 64 %
Platelets: 388 10*3/uL (ref 150–400)
RBC: 3.17 MIL/uL — ABNORMAL LOW (ref 3.87–5.11)
RDW: 14.7 % (ref 11.5–15.5)
WBC: 12.6 10*3/uL — ABNORMAL HIGH (ref 4.0–10.5)
nRBC: 1.3 % — ABNORMAL HIGH (ref 0.0–0.2)

## 2020-12-17 LAB — RH IG WORKUP (INCLUDES ABO/RH)
Fetal Screen: NEGATIVE
Gestational Age(Wks): 37
Unit division: 0

## 2020-12-17 LAB — COMPREHENSIVE METABOLIC PANEL
ALT: 21 U/L (ref 0–44)
AST: 33 U/L (ref 15–41)
Albumin: 1.6 g/dL — ABNORMAL LOW (ref 3.5–5.0)
Alkaline Phosphatase: 115 U/L (ref 38–126)
Anion gap: 7 (ref 5–15)
BUN: 7 mg/dL (ref 6–20)
CO2: 24 mmol/L (ref 22–32)
Calcium: 8.1 mg/dL — ABNORMAL LOW (ref 8.9–10.3)
Chloride: 106 mmol/L (ref 98–111)
Creatinine, Ser: 1.05 mg/dL — ABNORMAL HIGH (ref 0.44–1.00)
GFR, Estimated: >60 mL/min (ref >60)
Glucose, Bld: 80 mg/dL (ref 70–99)
Potassium: 4.1 mmol/L (ref 3.5–5.1)
Sodium: 137 mmol/L (ref 135–145)
Total Bilirubin: 0.4 mg/dL (ref 0.3–1.2)
Total Protein: 5.2 g/dL — ABNORMAL LOW (ref 6.5–8.1)

## 2020-12-17 MED ORDER — MEDROXYPROGESTERONE ACETATE 150 MG/ML IM SUSP
150.0000 mg | Freq: Once | INTRAMUSCULAR | Status: AC | PRN
  Administered 2020-12-18: 150 mg via INTRAMUSCULAR
  Filled 2020-12-17: qty 1

## 2020-12-17 NOTE — Progress Notes (Signed)
Main pharmacy called for Lasix not on unit

## 2020-12-17 NOTE — Progress Notes (Signed)
Post Partum Day 2 Subjective: She is still feeling fatigued but states it is improving. She does not endorse any pain, headaches, or vision changes. She does report bilateral swelling in her hands and feet. She is ambulating, voiding, and tolerating oral intake without difficulty.   Objective: Blood pressure 136/83, pulse 91, temperature 97.8 F (36.6 C), temperature source Axillary, resp. rate 16, height 5\' 7"  (1.702 m), weight 85.8 kg, last menstrual period 03/31/2020, SpO2 99 %, unknown if currently breastfeeding.  Physical Exam:  General: alert, cooperative, and no distress Lochia: appropriate Uterine Fundus: firm Incision: none DVT Evaluation: No evidence of DVT seen on physical exam.  Recent Labs    12/15/20 0902 12/15/20 2204  HGB 8.0* 8.2*  HCT 25.6* 26.5*    Assessment/Plan: Jessica Page is a 25 year old G3P1021 on PPD#2 who is being monitored for PEC. Blood pressures have been in the 130-140s systolic and 90-100s diastolic over the past 24 hours but most recently 136/83 after being started on Lisinopril overnight. Currently on Procardia 30 mg BID, Lisinopril 5 mg, and Lasix 20 mg BID x 3 days. -Continue to monitor BP -Continue procardia, lisinopril, lasix -Anticipate discharge in the next 24-48 hours if pressures are tolerable   LOS: 2 days   21 12/17/2020, 7:56 AM

## 2020-12-18 ENCOUNTER — Other Ambulatory Visit (HOSPITAL_COMMUNITY): Payer: Self-pay

## 2020-12-18 DIAGNOSIS — O165 Unspecified maternal hypertension, complicating the puerperium: Secondary | ICD-10-CM

## 2020-12-18 DIAGNOSIS — O99320 Drug use complicating pregnancy, unspecified trimester: Secondary | ICD-10-CM

## 2020-12-18 MED ORDER — NIFEDIPINE ER 30 MG PO TB24
30.0000 mg | ORAL_TABLET | Freq: Two times a day (BID) | ORAL | 0 refills | Status: AC
  Filled 2020-12-18: qty 60, 30d supply, fill #0

## 2020-12-18 MED ORDER — FUROSEMIDE 20 MG PO TABS
20.0000 mg | ORAL_TABLET | Freq: Two times a day (BID) | ORAL | 0 refills | Status: AC
  Filled 2020-12-18: qty 6, 3d supply, fill #0

## 2020-12-18 MED ORDER — ACETAMINOPHEN 325 MG PO TABS
650.0000 mg | ORAL_TABLET | ORAL | 0 refills | Status: AC | PRN
  Filled 2020-12-18: qty 30, 3d supply, fill #0

## 2020-12-18 MED ORDER — LISINOPRIL 5 MG PO TABS
5.0000 mg | ORAL_TABLET | Freq: Once | ORAL | Status: AC
  Administered 2020-12-18: 5 mg via ORAL
  Filled 2020-12-18: qty 1

## 2020-12-18 MED ORDER — LISINOPRIL 10 MG PO TABS
10.0000 mg | ORAL_TABLET | Freq: Every day | ORAL | 0 refills | Status: AC
  Filled 2020-12-18: qty 30, 30d supply, fill #0

## 2020-12-18 MED ORDER — FERROUS SULFATE 325 (65 FE) MG PO TABS
325.0000 mg | ORAL_TABLET | ORAL | 3 refills | Status: AC
  Filled 2020-12-18: qty 30, 60d supply, fill #0
  Filled 2021-05-10: qty 30, 60d supply, fill #1

## 2020-12-18 MED ORDER — MEASLES, MUMPS & RUBELLA VAC IJ SOLR
0.5000 mL | Freq: Once | INTRAMUSCULAR | Status: AC
  Administered 2020-12-18: 0.5 mL via SUBCUTANEOUS
  Filled 2020-12-18: qty 0.5

## 2020-12-18 MED ORDER — IBUPROFEN 600 MG PO TABS
600.0000 mg | ORAL_TABLET | Freq: Four times a day (QID) | ORAL | 0 refills | Status: AC
  Filled 2020-12-18: qty 30, 8d supply, fill #0

## 2020-12-18 NOTE — Social Work (Signed)
CSW escorted DHHS social worker Timothy Lasso to room to meet with MOB.    CPS is actively working to determine a disposition for the infant. CPS agreed to provide updates when a plan is decided.    CSW provided updates to the Medical team.    Vivi Barrack, MSW, LCSW [facility]  Clinical Social Worker  [phone] 12/18/2020  2:54 PM

## 2020-12-18 NOTE — Discharge Instructions (Signed)
Monitor your blood pressure at home. Goal <130/80. Please be seen right away if BP staying above >160/110, persistent headache not improved with tylenol/ibuprofen/rest while BP also still high, blurred vision, shortness of breath, or chest pain.   The lasix will be done in a few days. You will continue procardia and lisinopril until told otherwise.

## 2020-12-20 ENCOUNTER — Encounter: Payer: Medicaid Other | Admitting: Obstetrics and Gynecology

## 2020-12-20 DIAGNOSIS — O1493 Unspecified pre-eclampsia, third trimester: Secondary | ICD-10-CM

## 2020-12-22 ENCOUNTER — Encounter: Payer: Self-pay | Admitting: Family Medicine

## 2020-12-25 ENCOUNTER — Ambulatory Visit: Payer: Medicaid Other

## 2020-12-26 ENCOUNTER — Telehealth (HOSPITAL_COMMUNITY): Payer: Self-pay

## 2020-12-26 NOTE — Telephone Encounter (Signed)
Other family member answered phone call. He states that patient is asleep. Will attempt call at a different time.   Signe Colt 12/26/20,1808

## 2020-12-31 ENCOUNTER — Inpatient Hospital Stay (HOSPITAL_COMMUNITY): Admit: 2020-12-31 | Payer: Self-pay

## 2021-01-26 ENCOUNTER — Encounter: Payer: Medicaid Other | Admitting: Obstetrics and Gynecology

## 2021-01-26 NOTE — Progress Notes (Incomplete)
° °  OBSTETRICS POSTPARTUM CLINIC PROGRESS NOTE  Subjective:     Jessica Page is a 25 y.o. G70P1021 female who presents for a postpartum visit. She is {1-10:13787} {time; units:18646} postpartum following a {delivery:12449}. I have fully reviewed the prenatal and intrapartum course. The delivery was at *** gestational weeks.  Anesthesia: {anesthesia types:812}. Postpartum course has been ***. Baby's course has been ***. Baby is feeding by {breast/bottle:69}. Bleeding: patient {HAS HAS RUE:45409} not resumed menses, with No LMP recorded.. Bowel function is {normal:32111}. Bladder function is {normal:32111}. Patient {is/is not:9024} sexually active. Contraception method desired is {contraceptive method:5051}. Postpartum depression screening: {neg default:13464::"negative"}.  EDPS score is ***.    The following portions of the patient's history were reviewed and updated as appropriate: allergies, current medications, past family history, past medical history, past social history, past surgical history, and problem list.  Review of Systems {ros; complete:30496}   Objective:    There were no vitals taken for this visit.  General:  alert and no distress   Breasts:  inspection negative, no nipple discharge or bleeding, no masses or nodularity palpable  Lungs: clear to auscultation bilaterally  Heart:  regular rate and rhythm, S1, S2 normal, no murmur, click, rub or gallop  Abdomen: soft, non-tender; bowel sounds normal; no masses,  no organomegaly.  ***Well healed Pfannenstiel incision   Vulva:  normal  Vagina: normal vagina, no discharge, exudate, lesion, or erythema  Cervix:  no cervical motion tenderness and no lesions  Corpus: normal size, contour, position, consistency, mobility, non-tender  Adnexa:  normal adnexa and no mass, fullness, tenderness  Rectal Exam: Not performed.         Labs:  Lab Results  Component Value Date   HGB 7.6 (L) 12/17/2020     Assessment:   No diagnosis  found.   Plan:    1. Contraception: {method:5051} 2. Will check Hgb for h/o postpartum anemia of less than 10.  3. Follow up in: {1-10:13787} {time; units:19136} or as needed.    Hildred Laser, MD [facility]

## 2021-01-30 NOTE — Progress Notes (Deleted)
° ° °  Post Partum Visit Note  Jessica Page is a 25 y.o. G56P1021 female who presents for a postpartum visit. She is 6 weeks postpartum following a normal spontaneous vaginal delivery.  I have fully reviewed the prenatal and intrapartum course. The delivery was at 37.5 gestational weeks.  Anesthesia: {anesthesia types:812}. Postpartum course has been ***. Baby is doing well***. Baby is feeding by bottle - {formula:72}. Bleeding {vag bleed:12292}. Bowel function is {normal:32111}. Bladder function is {normal:32111}. Patient {is/is not:9024} sexually active. Contraception method is Depo-Provera injections. Postpartum depression screening: {gen negative/positive:315881}.   The pregnancy intention screening data noted above was reviewed. Potential methods of contraception were discussed. The patient elected to proceed with No data recorded.    Health Maintenance Due  Topic Date Due   COVID-19 Vaccine (1) Never done   Pneumococcal Vaccine 69-27 Years old (1 - PCV) Never done   HPV VACCINES (1 - 2-dose series) Never done   Hepatitis C Screening  Never done    {Common ambulatory SmartLinks:19316}  Review of Systems {ros; complete:30496}  Objective:  There were no vitals taken for this visit.   General:  {gen appearance:16600}   Breasts:  {desc; normal/abnormal/not indicated:14647}  Lungs: {lung exam:16931}  Heart:  {heart exam:5510}  Abdomen: {abdomen exam:16834}   Wound {Wound assessment:11097}  GU exam:  {desc; normal/abnormal/not indicated:14647}       Assessment:    There are no diagnoses linked to this encounter.  *** postpartum exam.   Plan:   Essential components of care per ACOG recommendations:  1.  Mood and well being: Patient with {gen negative/positive:315881} depression screening today. Reviewed local resources for support.  - Patient tobacco use? {tobacco use:25506}  - hx of drug use? {yes/no:25505}    2. Infant care and feeding:  -Patient currently breastmilk  feeding? {yes/no:25502}  -Social determinants of health (SDOH) reviewed in EPIC. No concerns***The following needs were identified***  3. Sexuality, contraception and birth spacing - Patient {DOES_DOES EGB:15176} want a pregnancy in the next year.  Desired family size is {NUMBER 1-10:22536} children.  - Reviewed forms of contraception in tiered fashion. Patient desired {PLAN CONTRACEPTION:313102} today.   - Discussed birth spacing of 18 months  4. Sleep and fatigue -Encouraged family/partner/community support of 4 hrs of uninterrupted sleep to help with mood and fatigue  5. Physical Recovery  - Discussed patients delivery and complications. She describes her labor as {description:25511} - Patient had a {CHL AMB DELIVERY:[phone]}. Patient had a {laceration:25518} laceration. Perineal healing reviewed. Patient expressed understanding - Patient has urinary incontinence? {yes/no:25515} - Patient {ACTION; IS/IS HYW:73710626} safe to resume physical and sexual activity  6.  Health Maintenance - HM due items addressed {Yes or If no, why not?:20788} - Last pap smear  Diagnosis  Date Value Ref Range Status  08/10/2020   Final   - Negative for intraepithelial lesion or malignancy (NILM)   Pap smear {done:10129} at today's visit.  -Breast Cancer screening indicated? {indicated:25516}  7. Chronic Disease/Pregnancy Condition follow up: {Follow up:25499}  - PCP follow up  Isabell Jarvis, RN [facility] [employer], [facility]

## 2021-01-31 ENCOUNTER — Ambulatory Visit: Payer: Medicaid Other | Admitting: Family Medicine

## 2021-05-10 ENCOUNTER — Other Ambulatory Visit (HOSPITAL_COMMUNITY): Payer: Self-pay

## 2021-05-16 ENCOUNTER — Other Ambulatory Visit (HOSPITAL_COMMUNITY): Payer: Self-pay
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
